# Patient Record
Sex: Female | Born: 1975
Health system: Southern US, Community
[De-identification: ages and names within clinical notes are randomized; demographics above are authoritative.]

## PROBLEM LIST (undated history)

## (undated) DIAGNOSIS — L709 Acne, unspecified: Secondary | ICD-10-CM

## (undated) DIAGNOSIS — E669 Obesity, unspecified: Secondary | ICD-10-CM

## (undated) HISTORY — DX: Obesity, unspecified: E66.9

## (undated) HISTORY — PX: OTHER SURGICAL HISTORY: SHX169

## (undated) HISTORY — DX: Acne, unspecified: L70.9

---

## 1998-02-21 ENCOUNTER — Emergency Department (HOSPITAL_COMMUNITY): Admission: EM | Admit: 1998-02-21 | Discharge: 1998-02-21 | Payer: Self-pay | Admitting: Emergency Medicine

## 1998-11-29 ENCOUNTER — Other Ambulatory Visit: Admission: RE | Admit: 1998-11-29 | Discharge: 1998-11-29 | Payer: Self-pay | Admitting: Obstetrics and Gynecology

## 1998-12-15 ENCOUNTER — Emergency Department (HOSPITAL_COMMUNITY): Admission: EM | Admit: 1998-12-15 | Discharge: 1998-12-15 | Payer: Self-pay | Admitting: Emergency Medicine

## 2000-02-14 ENCOUNTER — Other Ambulatory Visit: Admission: RE | Admit: 2000-02-14 | Discharge: 2000-02-14 | Payer: Self-pay | Admitting: *Deleted

## 2000-12-04 ENCOUNTER — Other Ambulatory Visit: Admission: RE | Admit: 2000-12-04 | Discharge: 2000-12-04 | Payer: Self-pay | Admitting: Obstetrics and Gynecology

## 2001-05-22 ENCOUNTER — Inpatient Hospital Stay (HOSPITAL_COMMUNITY): Admission: AD | Admit: 2001-05-22 | Discharge: 2001-05-22 | Payer: Self-pay | Admitting: Obstetrics & Gynecology

## 2001-06-03 ENCOUNTER — Inpatient Hospital Stay (HOSPITAL_COMMUNITY): Admission: AD | Admit: 2001-06-03 | Discharge: 2001-06-03 | Payer: Self-pay | Admitting: Obstetrics and Gynecology

## 2001-06-05 ENCOUNTER — Inpatient Hospital Stay (HOSPITAL_COMMUNITY): Admission: AD | Admit: 2001-06-05 | Discharge: 2001-06-07 | Payer: Self-pay | Admitting: Obstetrics & Gynecology

## 2001-06-08 ENCOUNTER — Encounter: Admission: RE | Admit: 2001-06-08 | Discharge: 2001-07-08 | Payer: Self-pay | Admitting: Obstetrics and Gynecology

## 2001-07-09 ENCOUNTER — Encounter: Admission: RE | Admit: 2001-07-09 | Discharge: 2001-08-08 | Payer: Self-pay | Admitting: Obstetrics and Gynecology

## 2001-07-11 ENCOUNTER — Other Ambulatory Visit: Admission: RE | Admit: 2001-07-11 | Discharge: 2001-07-11 | Payer: Self-pay | Admitting: Obstetrics and Gynecology

## 2001-09-08 ENCOUNTER — Encounter: Admission: RE | Admit: 2001-09-08 | Discharge: 2001-10-08 | Payer: Self-pay | Admitting: Obstetrics and Gynecology

## 2001-10-09 ENCOUNTER — Encounter: Admission: RE | Admit: 2001-10-09 | Discharge: 2001-11-08 | Payer: Self-pay | Admitting: Obstetrics and Gynecology

## 2001-12-09 ENCOUNTER — Encounter: Admission: RE | Admit: 2001-12-09 | Discharge: 2002-01-08 | Payer: Self-pay | Admitting: Obstetrics and Gynecology

## 2002-02-08 ENCOUNTER — Encounter: Admission: RE | Admit: 2002-02-08 | Discharge: 2002-03-10 | Payer: Self-pay | Admitting: Obstetrics and Gynecology

## 2003-02-11 ENCOUNTER — Other Ambulatory Visit: Admission: RE | Admit: 2003-02-11 | Discharge: 2003-02-11 | Payer: Self-pay | Admitting: Obstetrics and Gynecology

## 2003-03-02 ENCOUNTER — Encounter (INDEPENDENT_AMBULATORY_CARE_PROVIDER_SITE_OTHER): Payer: Self-pay | Admitting: Specialist

## 2003-03-02 ENCOUNTER — Ambulatory Visit (HOSPITAL_COMMUNITY): Admission: RE | Admit: 2003-03-02 | Discharge: 2003-03-02 | Payer: Self-pay | Admitting: Obstetrics and Gynecology

## 2003-03-12 ENCOUNTER — Emergency Department (HOSPITAL_COMMUNITY): Admission: AD | Admit: 2003-03-12 | Discharge: 2003-03-12 | Payer: Self-pay | Admitting: Family Medicine

## 2006-01-17 ENCOUNTER — Ambulatory Visit (HOSPITAL_COMMUNITY): Admission: RE | Admit: 2006-01-17 | Discharge: 2006-01-17 | Payer: Self-pay | Admitting: Obstetrics and Gynecology

## 2006-04-01 ENCOUNTER — Encounter (INDEPENDENT_AMBULATORY_CARE_PROVIDER_SITE_OTHER): Payer: Self-pay | Admitting: *Deleted

## 2006-04-01 ENCOUNTER — Ambulatory Visit (HOSPITAL_COMMUNITY): Admission: RE | Admit: 2006-04-01 | Discharge: 2006-04-01 | Payer: Self-pay | Admitting: Obstetrics and Gynecology

## 2007-06-26 ENCOUNTER — Inpatient Hospital Stay (HOSPITAL_COMMUNITY): Admission: RE | Admit: 2007-06-26 | Discharge: 2007-06-29 | Payer: Self-pay | Admitting: Obstetrics and Gynecology

## 2007-06-26 ENCOUNTER — Encounter (INDEPENDENT_AMBULATORY_CARE_PROVIDER_SITE_OTHER): Payer: Self-pay | Admitting: Obstetrics and Gynecology

## 2007-07-01 ENCOUNTER — Encounter: Admission: RE | Admit: 2007-07-01 | Discharge: 2007-07-30 | Payer: Self-pay | Admitting: Obstetrics and Gynecology

## 2007-07-31 ENCOUNTER — Encounter: Admission: RE | Admit: 2007-07-31 | Discharge: 2007-08-30 | Payer: Self-pay | Admitting: Obstetrics and Gynecology

## 2007-08-31 ENCOUNTER — Encounter: Admission: RE | Admit: 2007-08-31 | Discharge: 2007-09-30 | Payer: Self-pay | Admitting: Obstetrics and Gynecology

## 2007-10-01 ENCOUNTER — Encounter: Admission: RE | Admit: 2007-10-01 | Discharge: 2007-10-30 | Payer: Self-pay | Admitting: Obstetrics and Gynecology

## 2007-10-31 ENCOUNTER — Encounter: Admission: RE | Admit: 2007-10-31 | Discharge: 2007-11-30 | Payer: Self-pay | Admitting: Obstetrics and Gynecology

## 2007-12-01 ENCOUNTER — Encounter: Admission: RE | Admit: 2007-12-01 | Discharge: 2007-12-30 | Payer: Self-pay | Admitting: Obstetrics and Gynecology

## 2007-12-19 ENCOUNTER — Emergency Department (HOSPITAL_COMMUNITY): Admission: EM | Admit: 2007-12-19 | Discharge: 2007-12-19 | Payer: Self-pay | Admitting: Family Medicine

## 2007-12-31 ENCOUNTER — Encounter: Admission: RE | Admit: 2007-12-31 | Discharge: 2008-01-30 | Payer: Self-pay | Admitting: Obstetrics and Gynecology

## 2008-01-31 ENCOUNTER — Encounter: Admission: RE | Admit: 2008-01-31 | Discharge: 2008-03-01 | Payer: Self-pay | Admitting: Obstetrics and Gynecology

## 2008-03-02 ENCOUNTER — Encounter: Admission: RE | Admit: 2008-03-02 | Discharge: 2008-03-29 | Payer: Self-pay | Admitting: Obstetrics and Gynecology

## 2008-03-30 ENCOUNTER — Encounter: Admission: RE | Admit: 2008-03-30 | Discharge: 2008-04-29 | Payer: Self-pay | Admitting: Obstetrics and Gynecology

## 2008-08-27 IMAGING — RF DG HYSTEROGRAM
7 series · 7 of 7 positions shown · IV contrast (omnipaque)
Comparison: none

CLINICAL DATA: Secondary infertility.  Assess tubal patency.  
HYSTEROSALPINGOGRAM:
Following sterile cleansing of the external cervix os with Betadine, hysterosalpingogram was performed via injection of approximately 15 cc of Omnipaque 300 into the endometrial canal without difficulty.
The endometrial canal is notable for the present of a focal filling defect emanating from the right cornual region to the right lower uterine segment portion of the myometrium.  This may represent stigmata from a submucosal component of a right-sided fibroid or may represent a polyp.  Correlation with pelvic ultrasound would be recommended for complete assessment.  The remainder of the endometrial canal is unremarkable.  The left fallopian tube has a normal morphology and free intraperitoneal spill is documented from this side.  The right fallopian tube has a normal morphology to the cornual and isthmic regions.  There is some free spill from the right side of the tube but there is also loculation of contrast in the region of the ampullary portion of the tube which is obscured as a result and the finding persists on post drainage film suggesting the possibility of peritubal adhesions in this location.  No evidence for loculation of contrast is seen on the left to suggest peritubal or peri-ovarian adhesions on this side.

[Series 1: run · 1 of 1 slices shown (1 of 7)]
[im 1/1]
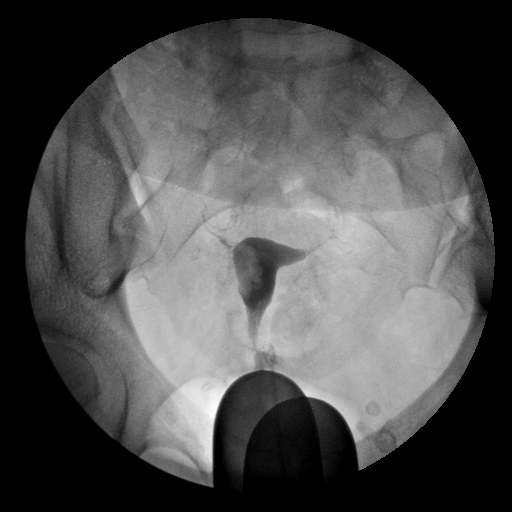

[Series 2: run · 1 of 1 slices shown (2 of 7)]
[im 1/1]
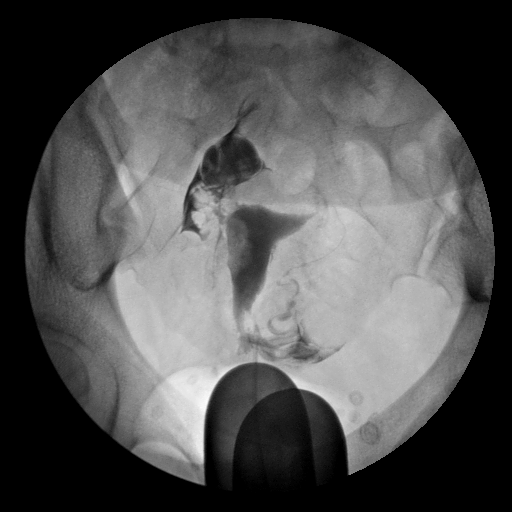

[Series 3: run · 1 of 1 slices shown (3 of 7)]
[im 1/1]
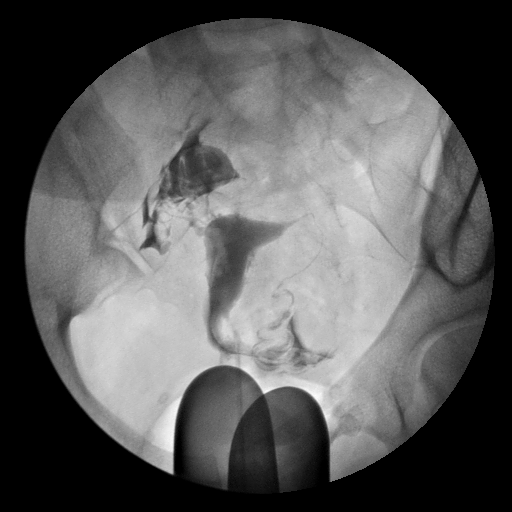

[Series 4: run · 1 of 1 slices shown (4 of 7)]
[im 1/1]
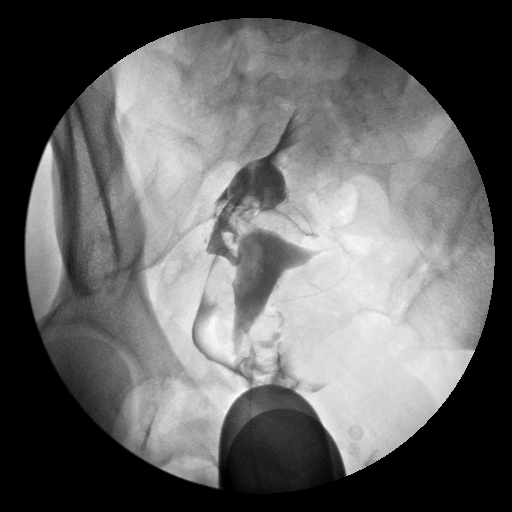

[Series 5: run · 1 of 1 slices shown (5 of 7)]
[im 1/1]
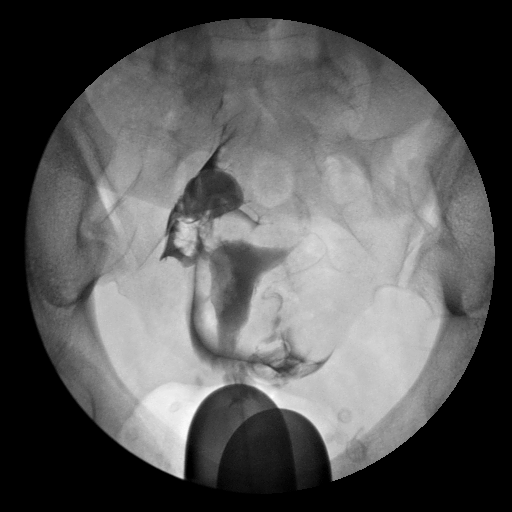

[Series 6: run · 1 of 1 slices shown (6 of 7)]
[im 1/1]
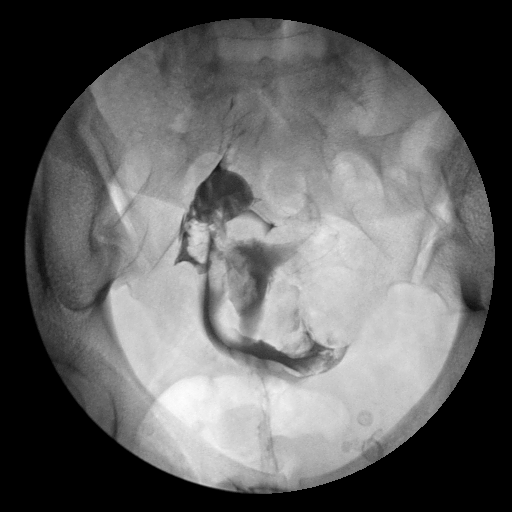

[Series 7: run · 1 of 1 slices shown (7 of 7)]
[im 1/1]
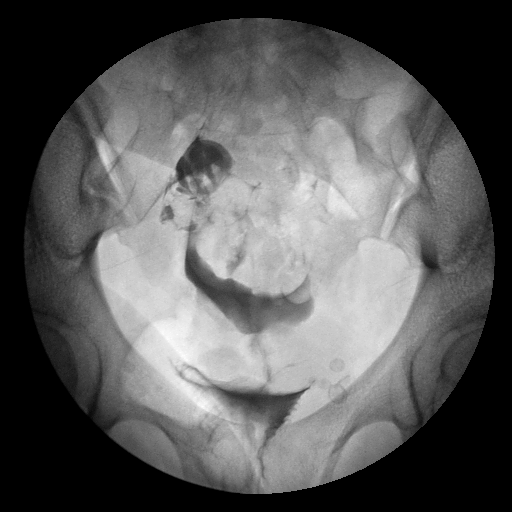

[7 of 7 positions shown; findings below may reference images not displayed]

IMPRESSION: 1.  Filling defect in the right lateral aspect of the endometrial canal worrisome for the presence of either a broad based polyp or submucosal component to a fibroid.  Correlation with pelvic ultrasound is recommended for complete assessment.  
2.  Left tubal patency confirmed. 
3.  Some free spill is noted from the right but the appearance with loculation of contrast in distal peritubal region suggest the possibility of peritubal adhesions around the ampullary portion of the tube.  
Today?s scan findings were reviewed and discussed with the patient.

## 2009-04-03 ENCOUNTER — Emergency Department (HOSPITAL_COMMUNITY): Admission: EM | Admit: 2009-04-03 | Discharge: 2009-04-03 | Payer: Self-pay | Admitting: Emergency Medicine

## 2010-04-15 ENCOUNTER — Inpatient Hospital Stay (INDEPENDENT_AMBULATORY_CARE_PROVIDER_SITE_OTHER)
Admission: RE | Admit: 2010-04-15 | Discharge: 2010-04-15 | Disposition: A | Discharge status: Home / Self Care | Payer: 59 | Source: Ambulatory Visit | Attending: Family Medicine | Admitting: Family Medicine

## 2010-04-15 DIAGNOSIS — B354 Tinea corporis: Secondary | ICD-10-CM

## 2010-04-15 DIAGNOSIS — L989 Disorder of the skin and subcutaneous tissue, unspecified: Secondary | ICD-10-CM

## 2010-04-15 LAB — GLUCOSE, CAPILLARY: Glucose-Capillary: 86 mg/dL (ref 70–99)

## 2010-05-30 NOTE — Op Note (Signed)
NAME:  Patricia Mayo, Patricia Mayo NO.:  [id number]   MEDICAL RECORD NO.:  [id number]          PATIENT TYPE:  INP   LOCATION:  9106                          FACILITY:  [facility]   PHYSICIAN:  Maxie Better, M.D.DATE OF BIRTH:  06-Jul-1975   DATE OF PROCEDURE:  06/26/2007  DATE OF DISCHARGE:                               OPERATIVE REPORT   PREOPERATIVE DIAGNOSES:  1. Previous  Fundal defect.  2. Term gestation.   POSTOPERATIVE DIAGNOSES:  1. Previous Fundal defect.  2. Term gestation.  3. Tubal adhesions.   PROCEDURE:  1. Primary cesarean section,  Kerr hysterotomy.  2. Right salpingectomy.   ANESTHESIA:  Spinal.   SURGEON:  Maxie Better, MD   ASSISTANT:  None.   PROCEDURE:  Under adequate spinal anesthesia, the patient was placed in  a supine position with the left lateral tilt.  She was sterilely prepped  and draped in usual fashion.  Indwelling Foley catheter was sterilely  placed.  Marcaine 0.25% was injected along the planned Pfannenstiel skin  incision site.  A Pfannenstiel skin incision was then made and carried  down to the rectus fascia.  The rectus fascia was opened transversely.  The rectus fascia was then bluntly and sharply dissected off the rectus  muscle in superior and inferior fashion.  The rectus muscle was split in  midline.  The parietal peritoneum was opened sharply and extended.  Large vessels were noted in the lower uterine segment.  The  vesicouterine peritoneum was opened transversely.  The uterus which was  very dextrorotated was centralized.  The bladder was then bluntly  dissected off the lower uterine segment.  A curvilinear low-transverse  uterine incision was then made and with blunt separation, was opened.  Artificial rupture of membranes and clear fluid noted.  Subsequent  delivery of a live female was accomplished.  Cord around the neck x1 was  reduced.  Baby was bulb suctioned on the abdomen.  Cord was clamped and  cut.  The  baby was transferred to the awaiting pediatrician who assigned  Apgars of 9 at 9 and 1 at 5 minutes.  The placenta was spontaneous and  intact, not sent to pathology.  The uterine cavity was cleaned of  debris.  The uterus was exteriorized to inspect the fundal defect that  had been previously documented on laparoscopy.  It was cleared, still  evident, but with a superficial membrane overlying.  The uterine  incision had no extension.  There was a bleeding on the right vessel  from those previous large vessels that were noted.  Individual figure-of-  eight sutures were then placed for hemostasis.  The uterine incision was  then closed in 2 layers, the first layer with 0 Monocryl in running  locked stitch and the second layer was imbricated using 0 Monocryl  suture.  Small bleeders inferiorly was cauterized.  Additional  hemostasis was then accomplished with figure-of-eight sutures in the  midline with good hemostasis noted.  Attention was then turned back to  the adnexa.  Left tube and ovaries were normal.  Right ovary was normal  and was noted that there was almost no presence of the fallopian tube on  the right except the fallopian tube was adherent to the fundus of the  uterus all the way across with the fimbriated ends not really  identifiable.  Using cautery, a careful dissection was then performed to  take the fallopian tube and re-establish its normal anatomy; however,  after that was performed, it was noted that there was no fimbriated end  noted.  There was no ability to imbricate the fallopian tube which  appeared to be destroyed at that point.  A decision was then made to  remove that tube to decrease any risk of ectopic pregnancy.  One clamp  was performed and the tube was removed.  The base was tied with a 2-0  Vicryl suture.  Small bleeding in the fundal aspect was hemostasized  with 3-0 Vicryl suture.  The uterus was then returned back to the  abdomen after copious  irrigation was performed.  The uterine incision  was inspected and small bleeders again cauterized with good hemostasis  noted.  The parietal peritoneum was closed with 2-0 Vicryl.  The rectus  fascia was closed with 0 Vicryl x2.  Bleeders on the surface of the  rectus fascia was then cauterized and suprapubic space was also  cauterized.  The subcutaneous area was approximated with interrupted 2-0  plain sutures.  Skin approximated using Ethicon staples.  Specimen was  right fallopian tube sent to pathology, placenta not sent.  Estimated  blood loss was 800 mL.  Intraoperative fluid was 3800 mL.  Urine output  600 mL, clear yellow urine.  Sponge and instrument counts x2 was  correct.  Weight of the baby was 7 pounds 12 ounces.  Complication was  none.  The patient tolerated the procedure well and was transferred to  recovery in stable condition.      Maxie Better, M.D.  Electronically Signed     [place]/MEDQ  D:  06/26/2007  T:  06/27/2007  Job:  [id number]

## 2010-05-30 NOTE — Discharge Summary (Signed)
NAME:  Patricia Mayo, Patricia Mayo NO.:  [id number]   MEDICAL RECORD NO.:  [id number]          PATIENT TYPE:  INP   LOCATION:  9106                          [facility]ILITY[facility]  [facility]   PHYSICIAN:  Maxie Better, M.D.DATE OF BIRTH:  1975/09/09   DATE OF ADMISSION:  06/26/2007  DATE OF DISCHARGE:  06/29/2007                               DISCHARGE SUMMARY   ADMISSION DIAGNOSES:  1. Term gestation.  2. Fundal uterine perforation defect.   DISCHARGE DIAGNOSES:  1. Term gestation, delivered.  2. Fundal perforation defect.  3. Right tubal disease.   PROCEDURES:  1. Primary cesarean section.  2. Right salpingectomy.   HISTORY OF PRESENT ILLNESS:  A 35 year old, gravida 4, para 1-0-2-1  female at term with a fundal perforation defect, which resulted in need  for primary cesarean section.   HOSPITAL COURSE:  The patient was admitted to [facility].  She  underwent a primary cesarean section and lyse.  The patient delivered a  live female, 7 pounds 12 ounces, and Apgars of 9 and 9.  The left tube  and ovaries were noted to be normal.  The right tube, however, was  surrounded with adhesions adherent to the right fundal aspect.  Please  see the dictated note for the operative surgery.  The patient had an  uncomplicated postoperative course.  The CBC on postop day #1 showed a  hemoglobin 9.8, hematocrit 27.9, platelet count of 118,000, and white  count of 4.9.  She had a repeat platelet count on June 28, 2007, that  was 151,000.  The pathology of the right fallopian tube showed benign  fallopian tube with mild chronic inflammation, hemorrhage, adhesion and  serosal adhesion.  By postop day #3, the patient was doing well.  She  had actually had a bowel movement.  Incision had no evidence of any  infection.  She was deemed well for discharge.   DISPOSITION:  Home.   CONDITION:  Stable.   DISCHARGE MEDICATIONS:  1. Percocet 1-2 tablets every 6 hours p.r.n. pain.  2. Niferex 1  p.o. daily.  3. Prenatal vitamins 1 p.o. daily.  4. Motrin 600 mg every 8 hours p.r.n. pain.   FOLLOWUP APPOINTMENTS:  At [facility] in 6 weeks.      Maxie Better, M.D.  Electronically Signed     [place]/MEDQ  D:  08/07/2007  T:  08/07/2007  Job:  [id number]

## 2010-06-02 NOTE — Op Note (Signed)
NAME:  Patricia Mayo, BIGLER NO.:  [id number]   MEDICAL RECORD NO.:  [id number]                   PATIENT TYPE:  AMB   LOCATION:  SDC                                  [facility]ILITY:  [facility]   PHYSICIAN:  Maxie Better, M.D.            DATE OF BIRTH:  1975-04-20   DATE OF PROCEDURE:  03/02/2003  DATE OF DISCHARGE:                                 OPERATIVE REPORT   PREOPERATIVE DIAGNOSES:  Elective termination intrauterine gestation at 8  plus weeks.   POSTOPERATIVE DIAGNOSES:  Elective termination intrauterine gestation at 8  plus weeks.   PROCEDURE:  Suction, dilation and evacuation.   ANESTHESIA:  MAC paracervical block.   SURGEON:  Maxie Better, M.D.   INDICATIONS FOR PROCEDURE:  A 35 year old, gravida 3, para 1-0-1-1 female at  eight plus weeks gestation by ultrasound conceived on birth control pills  who desires termination of pregnancy.  Her blood type is noted to be O  positive.  The risks and benefits of the procedure have been explained to  the patient, consent was signed, the patient was transferred to the  operating room.   DESCRIPTION OF PROCEDURE:  Under adequate monitored anesthesia, the patient  was placed in the dorsal lithotomy position, she was sterilely prepped and  draped in the usual fashion, the bladder was catheterized for a moderate  amount of urine. Examination under anesthesia revealed an anteverted eight  week size uterus without any adnexal mass.  A bivalve speculum was placed in  the vagina, 20 mL of 1% Nesacaine was injected paracervically at 3 and 9  o'clock. The cervix was noted to be parous. The anterior lip of the cervix  was grasped with a single tooth tenaculum, the cervix was then serially  dilated up to a #29 Pratt dilator. A #8 mm curved suction cannula was  introduced into the uterine cavity without incident. A moderate amount of  products of conception was obtained. The cavity was then curetted,  resuctioned  until all tissue was then felt to have been removed at which  time all instrument were then removed from the vagina.  The specimen was  labeled as products of conception and sent to pathology. Estimated blood  loss was about 50 mL.  Complication was none.  The patient tolerated the  procedure well and was transferred to the recovery room in stable condition.                                               Maxie Better, M.D.    [place]/MEDQ  D:  03/02/2003  T:  03/02/2003  Job:  [id number]

## 2010-06-02 NOTE — H&P (Signed)
[facility]  Patient:    Patricia Mayo, Patricia Mayo Visit Number: [id number] MRN: [id number]          Service Type: OBS Location: [facility] Attending Physician:  Genia Del Dictated by:   Genia Del, M.D. Admit Date:  06/05/2001                           History and Physical  DATE OF BIRTH:  12/01/1975.  BRIEF HISTORY The patient is a 35 year old G2, P0, A1, expected date of delivery by ultrasound is June 22, 2001, at 37 weeks and 4 days gestation.  REASON FOR ADMISSION:  Spontaneous rupture of membranes, clear fluid around 6:30 a.m. on Jun 05, 2001.  HISTORY OF PRESENT ILLNESS:  The patient felt abundant clear fluid leak this morning around 6:30.  It continued to drip.  The patient felt very mild irregular uterine contractions.  No vaginal bleeding.  Good fetal movements. No PIH symptoms.  She was advised to present immediately to maternity admission for evaluation.  PAST MEDICAL HISTORY:  Negative.  PAST SURGICAL HISTORY:  Negative.  PAST OBSTETRICAL HISTORY:  Positive for a therapeutic abortion at about 8 weeks in 1997, no complication.  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  FAMILY HISTORY:  Noncontributory.  SOCIAL HISTORY:  The patient is married and a nonsmoker.  HISTORY OF PRESENT PREGNANCY:  First trimester was normal.  Labs showed a hemoglobin of 12.8, platelets 235, blood type Rh 0+. Rh antibodies negative. Sickle cell trait negative.  Thalassemia negative.  RPR nonreactive.  HBsAg negative.  HIV nonreactive.  Rubella immune.  Pap smear within normal limits. Gonorrhea and Chlamydia negative.  At 11 plus weeks she had an ultrasound and dating was changed by ultrasound for an expected date of delivery June 22, 2001.  In the second trimester the triple test was within normal limits at 16 weeks. An ultrasound reviewing all anatomy was within  normal limits.  Cervix 2.7 cm. No dilatation.  Placenta was anterior normal.  At follow up vaginal examination cervix was stable 2.5 to 3.0 cm long and firm and closed.  In the third trimester a 1 hour glucose tolerance test was normal at 119.  The patient developed mild cholestasis of pregnancy at around 34 plus weeks.  She had an ultrasound done at 35 weeks showing an amniotic fluid at 31st percentile growth at 35 percentile.  On the same visit at 35 plus weeks her blood pressures were borderline at 146/80, 140/82.  She had PIH labs that were all within normal limits.  That was done again at 36 plus weeks and the [facility] labs were again within normal limits.  NNST was done which was not reactive, biophysical profile was reassuring.  Group B streptococcus was positive at 35+ weeks.  REVIEW OF SYSTEMS:  Constitutional:  Negative.  HEENT:  Negative. Respiratory:  Negative.  Cardiovascular:  Negative.  Urologic/GI:  Negative. Neurologic/endocrinologic/dermatologic:  Negative.  PHYSICAL EXAMINATION:  GENERAL:  No apparent distress.  VITAL SIGNS:  Blood pressure 133/76, pulse at 102, respiratory rate 20, temperature 98.0.  LUNGS:  Clear bilaterally.  HEART:  Regular cardiac rhythm, no murmur.  ABDOMEN: Gravid uterus.  Uterine height corresponds well to 37 weeks. Cephalic presentation.  VAGINAL:  Examination by nurse, abundant clear amniotic fluid. Vaginal examination:  Cervix is fingertip.  Cephalic presentation.  Lower limbs normal.  FETAL  HEART RATE:  Monitoring of fetal heart rate 140, with variability present. Not reactive yet.  No decelerations.  Uterine contractions irregular and mild.  IMPRESSION:  G2, P0, A1, 37 plus weeks gestation with spontaneous rupture of membranes, not currently in labor.  Group B streptococcus positive.  Fetal well-being reassuring.  PLAN:  Admit to labor and delivery.  Start on penicillin G protocol and Pitocin low dose protocol.  Monitoring and  expectant management for a probable vaginal delivery.  IMPRESSION:  G2, P0, A1, 37 plus weeks gestation with spontaneous rupture of membranes, not currently in labor. Group B streptococcus positive. Fetal well-being reassuring. Dictated by:   Genia Del, M.D. Attending Physician:  Genia Del DD:  06/05/01 TD:  06/05/01 Job: [id number]

## 2010-06-02 NOTE — Op Note (Signed)
[facility]  Patient:    Patricia Mayo, Patricia Mayo Visit Number: [id number] MRN: [id number]          Service Type: OBS Location: 910A 9104 01 Attending Physician:  Genia Del Dictated by:   Marina Gravel, M.D. Proc. Date: 06/05/01 Admit Date:  06/05/2001 Discharge Date: 06/07/2001                             Operative Report  PREOPERATIVE DIAGNOSES:       1. Term intrauterine pregnancy.                               2. Severe repetitive variables.                               3. Occipitoposterior position.  POSTOPERATIVE DIAGNOSES:      1. Term intrauterine pregnancy.                               2. Severe repetitive variables.                               3. Occipitoposterior position.  OPERATION:                    Vaccum-assisted vaginal delivery from                               complete-complete posterior direct OP position.  SURGEON:                      Marina Gravel, M.D.  INDICATIONS:                  The patient was pushing and noted to have repetitive severe variables to the 70s.  Also found to be direct OP position. Given these findings, I recommend we proceed with a vacuum-assisted vaginal delivery to expedite the delivery of the fetus.  DESCRIPTION OF PROCEDURE:     After adequate epidural anesthesia was confirmed, a Foley catheter had already drained the bladder, and this was removed.  The patient was examined under anesthesia with the above findings noted.  The Mityvac was placed on the fetal vertex just anterior to the posterior fontanelle in the midline.  With the following contraction, the Mityvac was pumped up to the high green zone, and with contraction, three tractions made per contraction.  Adequate descent with each contraction.  It took a total of four contractions to deliver the fetus.  One pop-off encountered.  The vacuum was released between contractions and a second degree episiotomy was made to facilitate  the delivery.  Subsequently, a viable female infant, Apgars of 8 and 10 was delivered.  The NICU team was present.  No apparent injuries to the fetus, mild caput noted.  The placenta was expelled spontaneously.  The second degree episiotomy was closed with a running suture of 2-0 and 3-0 chromics.  Estimated blood loss was 500 cc.  There were no complications. Dictated by:   Marina Gravel, M.D. Attending Physician:  Genia Del DD:  06/05/01 TD:  06/09/01 Job: [id number]

## 2010-06-02 NOTE — Op Note (Signed)
NAME:  ALEXSUS, PAPADOPOULOS NO.:  [id number]   MEDICAL RECORD NO.:  [id number]          PATIENT TYPE:  AMB   LOCATION:  SDC                           [facility]ILITY[facility]  [facility]   PHYSICIAN:  Maxie Better, M.D.DATE OF BIRTH:  1975/06/18   DATE OF PROCEDURE:  04/01/2006  DATE OF DISCHARGE:                               OPERATIVE REPORT   PREOPERATIVE DIAGNOSIS:  Endometrial thickening, infertility.   PROCEDURE:  diagnostic hysteroscopy, dilation and curettage,  hysteroscopic resection of endometrial mass, diagnostic laparoscopy,  chromopertubation, lysis of adhesions.   POSTOPERATIVE DIAGNOSIS:  Endometrial thickening, infertility.   ANESTHESIA:  General, paracervical block.   SURGEON:  Maxie Better, M.D.   ASSISTANT:  Dr. Richardean Sale for the laparoscopy   PROCEDURE:  Under adequate general anesthesia the patient is placed in  the dorsal lithotomy position.  She was sterilely prepped and draped in  usual fashion.  An indwelling Foley catheter was sterilely placed.  Examination under anesthesia revealed an anteverted uterus.  No adnexal  small masses could be appreciated.  A bivalve speculum was placed in the  vagina.  10 mL of 1% Nesacaine was injected paracervically at 3 and 9  o'clock.  The anterior lip of the cervix grasped with a single-tooth  tenaculum.  The cervix was then serially dilated up number 25 Pratt  dilator.  A diagnostic small hysteroscope was introduced into the  uterine cavity without incident.  Inspection revealed no masses in the  endocervical canal. The left tubal ostia could be seen well.  The right  tubal ostia could not be seen due to a fungating mass arising off the  lateral wall of the uterus on the right. The hysteroscope was removed.  The cervix was further dilated to #29 [facility] dilator.  A resectoscope was  introduced into the uterine cavity.  The fungating mass was resected off  the right lateral wall. After the resection of the  mass the right tubal  ostia could then be seen. The resectoscope was removed.  The cavity was  then gently curetted. The bivalve speculum was removed.  The Acorn  cannula was introduced into the cervical os and attached to the  tenaculum for manipulation of the uterus maintaining sterile technique.  Attention was then turned to the abdomen.  Quarter percent Marcaine was  injected infraumbilically and small vertical infraumbilical incision was  then made.  Veress needle was introduced, tested with normal saline and  carbon dioxide was insufflated.  Opening pressure of 7 was noted. 2.5  liters of CO2 was insufflated. The Veress needle was then removed.  A  disposable 10 mm trocar was introduced in the abdomen without incident.  A lighted video laparoscope was introduced through that port and the  pelvis inspected.  There was fluid presumably secondary to the  hysteroscope just being performed and noted in the pelvis. A small  incision was then placed suprapubically and 5 mm port was placed under  direct visualization. Using the probe the pelvis was inspected.  The  upper abdomen was also inspected and normal liver edge was noted.  No  adhesions noted.  The appendix appeared to be elongated, had a small  adhesion proximally, otherwise was normal and appeared to be possibly  retrocecal. The left tube and ovary was noted to be normal and the fluid  was aspirated out of the posterior cul-de-sac. No evidence of  endometriosis was noted. The right ovary was normal.  The right tube at  its midportion was appeared to be adherent to the fundal posterior  aspect of the uterus and appeared slightly dilated. The distal end did  not appear to be clubbed. On close inspection it appears that the uterus  has a perforation with the tube sucked into that perforation. Decision  was then made to place a third site in the right lower quadrant. A small  incision was then made and a 5-mm port was placed and  atraumatic grasper  used to grasp the right fallopian tube which was folded on itself and  the sharp dissection was then performed and the portion of tube removed  out of the small uterine defect. At that point, decision was then made  to perform a chromopertubation to further assess the status of that  right tube and IV antibiotic was started. Dilute methylene blue was  injected through the acorn port and with the injection fluid transmitted  across the perforation. At that point the procedure was terminated. The  abdomen was irrigated and suctioned debris.  The fallopian tube was  inspected.  No bleeders were noted.  The area overlying the lower  uterine defect was cauterized for hemostasis and procedure was felt to  be complete. At that point the lower ports removed under direct  visualization.  The abdomen was deflated.  The infraumbilical port was  then removed. The lower incision was closed with a 4-0 Vicryl  subcuticular stitch.  The infraumbilical port site was closed with 0  Vicryl figure-of-eight suture for the fascia and the skin approximated 4-  0 Vicryl suture.  The instruments in the vagina were removed.  No trauma  or laceration of the cervix was noted.  Specimen was the resected  endometrial mass and endometrial curettings sent to pathology.  Estimated blood loss was minimal.  Complication was none. The patient  tolerated the procedure well, was transferred to recovery in stable  condition.      Maxie Better, M.D.  Electronically Signed     [place]/MEDQ  D:  04/01/2006  T:  04/02/2006  Job:  [id number]

## 2010-06-02 NOTE — H&P (Signed)
NAME:  Patricia Mayo, BURGGRAF NO.:  [id number]   MEDICAL RECORD NO.:  [id number]                   PATIENT TYPE:  AMB   LOCATION:  SDC                                  FACILITY:  [facility]   PHYSICIAN:  Maxie Better, M.D.            DATE OF BIRTH:  1975/02/28   DATE OF ADMISSION:  DATE OF DISCHARGE:                                HISTORY & PHYSICAL   CHIEF COMPLAINT:  Desires termination of pregnancy.   HISTORY OF PRESENT ILLNESS:  The patient is a  35 year old gravida 3, para  1, 0-1-1 married black female, 8 plus weeks gestation by ultrasound,  conceived on birth control pills who is now admitted for suction dilation  and evacuation secondary to desired termination of pregnancy.   ALLERGIES:  No known drug allergies.   MEDICATIONS:  None.   MEDICAL HISTORY:  Negative.   PAST SURGICAL HISTORY:  Dilatation and evacuation in 1997.   OBSTETRIC HISTORY:  Therapeutic abortion in 1997. Vacuum assisted vaginal  delivery in 2003.   FAMILY HISTORY:  Noncontributory.   SOCIAL HISTORY:  Married, nonsmoker. In nursing tech program in school. One  child.   REVIEW OF SYSTEMS:  Negative.   PHYSICAL EXAMINATION:  GENERAL:  Well developed, well nourished black female  in no acute distress.  VITAL SIGNS:  Blood pressure 110/72, temperature 98.3, weight 166 pounds.  HEENT:  Sclerae anicteric. Pink conjunctivae. Oropharynx negative.  HEART:  Regular rate and rhythm without murmur.  LUNGS:  Clear to auscultation.  BREASTS:  Soft, nontender, no palpable mass.  BACK:  No CVA tenderness.  ABDOMEN:  Soft, nontender.  NODES:  No palpable supraclavicular or axillary nodes.  EXTREMITIES:  No edema. No calf tenderness.  PELVIC:  Vulva showed no lesions. Vagina no discharge. Cervix was parous.  Uterus was anterior, 8 week size. Adnexa nontender, no palpable mass.  RECTAL:  Examination deferred.   IMPRESSION:  Desired termination of pregnancy, intrauterine gestation at  8  plus weeks.   PLAN:  Suction dilation evacuation. The risks and benefits of the procedure  have been explained to the patient including  but not limited to infection,  bleeding, retained products of conception and its management, injury to  surrounding  organ structures such as the bladder, bowel or ureter, internal  scar tissue that may affect fertility in the future, bleeding which may  require blood transfusion. Uterine perforation and its management  was  reviewed. The patient's blood type is O positive. Routine admission labs  have been ordered.                                               Maxie Better, M.D.    [place]/MEDQ  D:  03/01/2003  T:  03/01/2003  Job:  [id number]

## 2010-08-28 ENCOUNTER — Other Ambulatory Visit: Payer: Self-pay | Admitting: Internal Medicine

## 2010-08-28 DIAGNOSIS — B359 Dermatophytosis, unspecified: Secondary | ICD-10-CM

## 2010-09-15 ENCOUNTER — Other Ambulatory Visit: Payer: 59

## 2010-09-15 ENCOUNTER — Ambulatory Visit
Admission: RE | Admit: 2010-09-15 | Discharge: 2010-09-15 | Disposition: A | Discharge status: Home / Self Care | Payer: 59 | Source: Ambulatory Visit | Attending: Internal Medicine | Admitting: Internal Medicine

## 2010-09-15 DIAGNOSIS — B359 Dermatophytosis, unspecified: Secondary | ICD-10-CM

## 2010-10-12 LAB — COMPREHENSIVE METABOLIC PANEL
ALT: 18
AST: 35
Albumin: 2.4 — ABNORMAL LOW
Alkaline Phosphatase: 111
CO2: 26
Calcium: 9.1
Creatinine, Ser: 0.86
GFR calc non Af Amer: >60
Glucose, Bld: 99
Sodium: 140
Total Bilirubin: 0.5
Total Protein: 5.4 — ABNORMAL LOW

## 2010-10-12 LAB — CBC
HCT: 36.2
Hemoglobin: 10.5 — ABNORMAL LOW
Hemoglobin: 10.6 — ABNORMAL LOW
MCHC: 35.1
MCV: 91.8
MCV: 92.8
MCV: 92.9
Platelets: 151
Platelets: 160
RBC: 3.01 — ABNORMAL LOW
RDW: 14.1
RDW: 14.4
WBC: 4.9
WBC: 5.8

## 2010-10-12 LAB — RPR: RPR Ser Ql: NONREACTIVE

## 2010-10-12 LAB — URIC ACID: Uric Acid, Serum: 7

## 2010-10-20 LAB — CULTURE, ROUTINE-ABSCESS: Culture: NO GROWTH

## 2010-12-24 ENCOUNTER — Emergency Department (HOSPITAL_COMMUNITY)
Admission: EM | Admit: 2010-12-24 | Discharge: 2010-12-24 | Disposition: A | Discharge status: Home / Self Care | Payer: 59 | Source: Home / Self Care | Attending: Emergency Medicine | Admitting: Emergency Medicine

## 2010-12-24 DIAGNOSIS — R05 Cough: Secondary | ICD-10-CM

## 2010-12-24 DIAGNOSIS — R51 Headache: Secondary | ICD-10-CM

## 2010-12-24 MED ORDER — ACETAMINOPHEN-CODEINE #3 300-30 MG PO TABS
1.0000 | ORAL_TABLET | Freq: Four times a day (QID) | ORAL | Status: AC | PRN
Start: 2010-12-24 — End: 2011-01-03

## 2010-12-24 NOTE — ED Notes (Signed)
C/o sinus pressure/facial pain, neck pain, sore throat, congestion, denies n/v/d or fever.

## 2010-12-24 NOTE — ED Provider Notes (Addendum)
History     CSN: [id number] Arrival date & time: 12/24/2010  6:15 PM   First MD Initiated Contact with Patient 12/24/10 1635      Chief Complaint  Patient presents with  . Sinusitis    sinus pressure, congestion, neck pain, sore throat    (Consider location/radiation/quality/duration/timing/severity/associated sxs/prior treatment) HPI Comments: sinus pressure "my face hurts" also my throat hurts feel body aches and feel very tired, some chills I think but no fevers so far or cough"  Patient is a 35 y.o. female presenting with sinusitis. The history is provided by the patient.  Sinusitis  This is a new problem. The current episode started 12 to 24 hours ago. The problem has been gradually worsening. There has been no fever. The pain is moderate. Associated symptoms include chills, sinus pressure and sore throat. Pertinent negatives include no congestion, no ear pain, no hoarse voice, no swollen glands, no cough and no shortness of breath. She has tried nothing for the symptoms. The treatment provided no relief.    History reviewed. No pertinent past medical history.  Past Surgical History  Procedure Date  . Cesarean section     History reviewed. No pertinent family history.  History  Substance Use Topics  . Smoking status: Not on file  . Smokeless tobacco: Not on file  . Alcohol Use:     OB History    Grav Para Term Preterm Abortions TAB SAB Ect Mult Living                  Review of Systems  Constitutional: Positive for chills.  HENT: Positive for sore throat and sinus pressure. Negative for ear pain, congestion and hoarse voice.   Respiratory: Negative for cough and shortness of breath.     Allergies  Review of patient's allergies indicates no known allergies.  Home Medications   Current Outpatient Rx  Name Route Sig Dispense Refill  . NYQUIL PO Oral Take by mouth.      . ACETAMINOPHEN-CODEINE #3 300-30 MG PO TABS Oral Take 1-2 tablets by mouth every 6 (six)  hours as needed for pain. 15 tablet 0    BP 127/83  Pulse 90  Temp(Src) 98.8 F (37.1 C) (Oral)  Resp 18  SpO2 100%  LMP 11/12/2010  Physical Exam  ED Course  Procedures (including critical care time)  Labs Reviewed - No data to display No results found.   1. Headache   2. Cough       MDM  Normal exam symptomatic less < 24 hrs        Jimmie Molly, MD 12/24/10 7829  Jimmie Molly, MD 12/24/10 2050

## 2011-09-07 ENCOUNTER — Telehealth: Payer: Self-pay | Admitting: Oncology

## 2011-09-07 NOTE — Telephone Encounter (Signed)
S/W pt re NP appt 9/9 @ 3 W/ Dr. Gaylyn Rong  Referring Dr. Gaylyn Rong Dx- Leukopenia NP packet mailed out.

## 2011-09-07 NOTE — Telephone Encounter (Signed)
C/D on 8/23 NP appt 9/9.

## 2011-09-21 ENCOUNTER — Encounter: Payer: Self-pay | Admitting: Oncology

## 2011-09-21 DIAGNOSIS — D709 Neutropenia, unspecified: Secondary | ICD-10-CM | POA: Insufficient documentation

## 2011-09-21 DIAGNOSIS — D72819 Decreased white blood cell count, unspecified: Secondary | ICD-10-CM | POA: Insufficient documentation

## 2011-09-24 ENCOUNTER — Ambulatory Visit: Payer: 59

## 2011-09-24 ENCOUNTER — Other Ambulatory Visit (HOSPITAL_BASED_OUTPATIENT_CLINIC_OR_DEPARTMENT_OTHER): Payer: 59 | Admitting: Lab

## 2011-09-24 ENCOUNTER — Ambulatory Visit (HOSPITAL_BASED_OUTPATIENT_CLINIC_OR_DEPARTMENT_OTHER): Payer: 59 | Admitting: Oncology

## 2011-09-24 ENCOUNTER — Encounter: Payer: Self-pay | Admitting: Oncology

## 2011-09-24 ENCOUNTER — Telehealth: Payer: Self-pay | Admitting: Oncology

## 2011-09-24 VITALS — BP 122/75 | HR 90 | Temp 98.6°F | Resp 20 | Ht 64.0 in | Wt 168.3 lb

## 2011-09-24 DIAGNOSIS — D72819 Decreased white blood cell count, unspecified: Secondary | ICD-10-CM

## 2011-09-24 DIAGNOSIS — D709 Neutropenia, unspecified: Secondary | ICD-10-CM

## 2011-09-24 DIAGNOSIS — R5383 Other fatigue: Secondary | ICD-10-CM

## 2011-09-24 LAB — MORPHOLOGY: PLT EST: ADEQUATE

## 2011-09-24 LAB — COMPREHENSIVE METABOLIC PANEL (CC13)
AST: 14 U/L (ref 5–34)
Alkaline Phosphatase: 43 U/L (ref 40–150)
CO2: 24 mEq/L (ref 22–29)
Chloride: 106 mEq/L (ref 98–107)
Creatinine: 0.8 mg/dL (ref 0.6–1.1)
Sodium: 138 mEq/L (ref 136–145)
Total Bilirubin: 0.2 mg/dL (ref 0.20–1.20)
Total Protein: 6.6 g/dL (ref 6.4–8.3)

## 2011-09-24 LAB — CBC WITH DIFFERENTIAL/PLATELET
BASO%: 0.5 % (ref 0.0–2.0)
Basophils Absolute: 0 10*3/uL (ref 0.0–0.1)
EOS%: 0.4 % (ref 0.0–7.0)
HCT: 37.4 % (ref 34.8–46.6)
HGB: 12.4 g/dL (ref 11.6–15.9)
LYMPH%: 39 % (ref 14.0–49.7)
MCH: 30.3 pg (ref 25.1–34.0)
MCV: 91.6 fL (ref 79.5–101.0)
NEUT%: 50 % (ref 38.4–76.8)
lymph#: 1.3 10*3/uL (ref 0.9–3.3)

## 2011-09-24 LAB — CHCC SMEAR

## 2011-09-24 NOTE — Progress Notes (Signed)
[facility]  Telephone:([phone]) [phone] [phone]     INITIAL HEMATOLOGY CONSULTATION    Referral MD:  Dr. Creola Corn, M.D.  Reason for Referral: leukocytopenia, neutropenia.     HPI: [name] is a 36 year old woman with no significant past medical history.  She has routine follow up with Dr. Timothy Lasso and a CBC was obtained on 08/30/2011 which showed WBC 2.8; ANC 1.2; Hgb 12.9; Plt 223.  Of note, the previous CBC on 09/15/2011 showed WBC 4.1; ANC 2.1; Hgb 12; Plt 240.  Given the leukopenia, neutropenia, she was kindly referred to the [facility] for evaluation.   [name] presented to the clinic for the first time today by herself.  She reports feeling well.  She has tried to lose weight by working out more; and has lost almost 20-lb over the last 6 months.  She has chronic mild fatigue; however, she is still working full time as a Firefighter at [employer].  She denied infection around 08/30/2011 when she had the CBC drawn that showed leukopenia/neutropenia. She has mild chronic back pain; her job involves heavy lifting.  She denied fever, headache, mucositis, nausea/vomiting, SOB, chest pain, cough, recurrent infection, visible source of bleeding, diarrhea, dysuria, pyuria, skin rash, abscess.   The rest of the 14-point review of system was negative.    Past Medical History  Diagnosis Date  . Obesity   . Acne     she has not been taking med for this (including Accutane)   :    Past Surgical History  Procedure Date  . Cesarean section   :   CURRENT MEDS: No current outpatient prescriptions on file.      No Known Allergies:  History reviewed. No pertinent family history.:  History   Social History  . Marital Status: Married    Spouse Name: N/A    Number of Children: 2  . Years of Education: N/A   Occupational History  .  [employer]    nurse aids at [employer]   Social History Main Topics  . Smoking status: Never Smoker     . Smokeless tobacco: Never Used  . Alcohol Use: No  . Drug Use: No  . Sexually Active:    Other Topics Concern  . Not on file   Social History Narrative  . No narrative on file  :  REVIEW OF SYSTEM:  The rest of the 14-point review of sytem was negative.   Exam: ECOG 0.   General:  well-nourished woman, in no acute distress.  Eyes:  no scleral icterus.  ENT:  There were no oropharyngeal lesions.  Neck was without thyromegaly.  Lymphatics:  Negative cervical, supraclavicular or axillary adenopathy.  Respiratory: lungs were clear bilaterally without wheezing or crackles.  Cardiovascular:  Regular rate and rhythm, S1/S2, without murmur, rub or gallop.  There was no pedal edema.  GI:  abdomen was soft, flat, nontender, nondistended, without organomegaly.  Muscoloskeletal:  no spinal tenderness of palpation of vertebral spine.  Skin exam was without echymosis, petichae.  Neuro exam was nonfocal.  Patient was able to get on and off exam table without assistance.  Gait was normal.  Patient was alerted and oriented.  Attention was good.   Language was appropriate.  Mood was normal without depression.  Speech was not pressured.  Thought content was not tangential.    LABS:  Lab Results  Component Value Date   WBC 3.4*  09/24/2011   HGB 12.4 09/24/2011   HCT 37.4 09/24/2011   PLT 181 09/24/2011   GLUCOSE 99 06/28/2007   ALT 18 06/28/2007   AST 35 06/28/2007   NA 140 06/28/2007   K 3.7 06/28/2007   CL 109 06/28/2007   CREATININE 0.86 06/28/2007   BUN 5* 06/28/2007   CO2 26 06/28/2007    Blood smear review:   I personally reviewed the patient's peripheral blood smear today.  There was isocytosis.  There was no peripheral blast.  There was no schistocytosis, spherocytosis, target cell, rouleaux formation, tear drop cell.  There was no giant platelets or platelet clumps.     ASSESSMENT AND PLAN:   1.  Leukopenia:    -  Differential:  Most likely reactive to a recent occult infection.  Her WBC  improved today.  There was no neutropenia. My review of your blood smear today did not show evidence of leukemia.  - work up/follow up:  Lab only appointment at the [facility] in about 3, 6, and 9months.  Return visit in about 1 year.  In the future, if your WBC and neutrophil significantly worsened, or you developed also anemia and low platelet count, then further work up may be considered.   I advised her to contact us if she has recurrent infection, bleeding, severe fatigue; or non intentional weight loss.   2.  Fatigue:  I sent for TSH to rule our hypothyroidism.   Thank you for this referral.    The length of time of the face-to-face encounter was 30 minutes. More than 50% of time was spent counseling and coordination of care.

## 2011-09-24 NOTE — Telephone Encounter (Signed)
Gave patient appt calendar for 1 year, labs and MD for 2014

## 2011-09-24 NOTE — Progress Notes (Signed)
Checked in new pt w/ no financial concerns. °

## 2011-09-24 NOTE — Patient Instructions (Addendum)
1.  Issue:  Low WBC (leukoepenia):  Most likely reactive to a recent infection.  Your WBC has improved today.  My review of your blood smear today did not show evidence of leukemia.  2.  Follow up:  Lab only appointment in about 3, 6, and 9months.  Return visit in about 1 year.  In the future, if your WBC and neutrophil significantly worsened, or you developed also anemia and low platelet count, then further work up may be considered.

## 2011-09-25 LAB — HIV ANTIBODY (ROUTINE TESTING W REFLEX): HIV: NONREACTIVE

## 2011-09-25 LAB — TSH: TSH: 1.839 u[IU]/mL (ref 0.350–4.500)

## 2011-09-25 LAB — VITAMIN B12: Vitamin B-12: 481 pg/mL (ref 211–911)

## 2011-09-25 LAB — ANA: Anti Nuclear Antibody(ANA): NEGATIVE

## 2011-12-24 ENCOUNTER — Other Ambulatory Visit: Payer: 59 | Admitting: Lab

## 2012-03-24 ENCOUNTER — Other Ambulatory Visit: Payer: 59 | Admitting: Lab

## 2012-06-23 ENCOUNTER — Other Ambulatory Visit: Payer: 59 | Admitting: Lab

## 2012-07-25 ENCOUNTER — Other Ambulatory Visit (HOSPITAL_BASED_OUTPATIENT_CLINIC_OR_DEPARTMENT_OTHER): Payer: 59

## 2012-07-25 DIAGNOSIS — D709 Neutropenia, unspecified: Secondary | ICD-10-CM

## 2012-07-25 DIAGNOSIS — D72819 Decreased white blood cell count, unspecified: Secondary | ICD-10-CM

## 2012-07-25 LAB — CBC WITH DIFFERENTIAL/PLATELET
Basophils Absolute: 0 10*3/uL (ref 0.0–0.1)
Eosinophils Absolute: 0 10*3/uL (ref 0.0–0.5)
HCT: 34.4 % — ABNORMAL LOW (ref 34.8–46.6)
HGB: 11.7 g/dL (ref 11.6–15.9)
LYMPH%: 41.3 % (ref 14.0–49.7)
MCV: 90.5 fL (ref 79.5–101.0)
MONO%: 9 % (ref 0.0–14.0)
NEUT#: 1.7 10*3/uL (ref 1.5–6.5)
NEUT%: 48.7 % (ref 38.4–76.8)
Platelets: 177 10*3/uL (ref 145–400)
RBC: 3.8 10*6/uL (ref 3.70–5.45)

## 2012-07-31 ENCOUNTER — Encounter: Payer: Self-pay | Admitting: Oncology

## 2012-08-07 ENCOUNTER — Encounter (HOSPITAL_COMMUNITY): Payer: Self-pay | Admitting: Emergency Medicine

## 2012-08-07 ENCOUNTER — Emergency Department (HOSPITAL_COMMUNITY)
Admission: EM | Admit: 2012-08-07 | Discharge: 2012-08-07 | Disposition: A | Discharge status: Home / Self Care | Payer: 59 | Source: Home / Self Care

## 2012-08-07 DIAGNOSIS — J309 Allergic rhinitis, unspecified: Secondary | ICD-10-CM

## 2012-08-07 MED ORDER — PHENYLEPHRINE-CHLORPHEN-DM 10-4-12.5 MG/5ML PO LIQD: 5.0000 mL | ORAL | Status: DC | PRN

## 2012-08-07 MED ORDER — FLUTICASONE PROPIONATE 50 MCG/ACT NA SUSP: 2.0000 | Freq: Every day | NASAL | Status: DC

## 2012-08-07 NOTE — ED Notes (Signed)
Reports onset of symptoms Tuesday evening.  Reports headache, sore throat, body aches, and nasal and chest congestion.

## 2012-08-07 NOTE — ED Provider Notes (Signed)
History    CSN: [id number] Arrival date & time 08/07/12  4098  First MD Initiated Contact with Patient 08/07/12 [phone]     Chief Complaint  Patient presents with  . URI   (Consider location/radiation/quality/duration/timing/severity/associated sxs/prior Treatment) HPI Comments: 37 year old female presents with headache, sore throat, bodyaches, nasal congestion, PND for 2 days. She has had a cough that D. OTC medication DayQuil and self with that. She denies known fevers.  Past Medical History  Diagnosis Date  . Obesity   . Acne     she has not been taking med for this (including Accutane)    Past Surgical History  Procedure Laterality Date  . Cesarean section     No family history on file. History  Substance Use Topics  . Smoking status: Never Smoker   . Smokeless tobacco: Never Used  . Alcohol Use: No   OB History   Grav Para Term Preterm Abortions TAB SAB Ect Mult Living                 Review of Systems  Constitutional: Positive for activity change, appetite change and fatigue. Negative for fever.  HENT: Positive for congestion, sore throat, rhinorrhea and postnasal drip. Negative for ear pain, neck pain, neck stiffness, sinus pressure and ear discharge.   Respiratory: Positive for cough. Negative for chest tightness and shortness of breath.   Cardiovascular: Negative.   Gastrointestinal: Negative.   Genitourinary: Negative.   Musculoskeletal: Positive for myalgias. Negative for back pain and arthralgias.  Skin: Negative for rash.    Allergies  Review of patient's allergies indicates no known allergies.  Home Medications   Current Outpatient Rx  Name  Route  Sig  Dispense  Refill  . ibuprofen (ADVIL,MOTRIN) 200 MG tablet   Oral   Take 200 mg by mouth every 6 (six) hours as needed for pain.         . Pseudoeph-Doxylamine-DM-APAP (NYQUIL PO)   Oral   Take by mouth.         . Pseudoephedrine-APAP-DM (DAYQUIL PO)   Oral   Take by mouth.         .  fluticasone (FLONASE) 50 MCG/ACT nasal spray   Nasal   Place 2 sprays into the nose daily.   16 g   1   . Phenylephrine-Chlorphen-DM 10-19-10.5 MG/5ML LIQD   Oral   Take 5 mLs by mouth every 4 (four) hours as needed.   120 mL   0    BP 131/79  Pulse 78  Temp(Src) 99.5 F (37.5 C) (Oral)  Resp 16  SpO2 100%  LMP 07/06/2012 Physical Exam  Constitutional: She is oriented to person, place, and time. She appears well-developed and well-nourished. No distress.  HENT:  Right Ear: External ear normal.  Left Ear: External ear normal.  Mouth/Throat: No oropharyngeal exudate.  OP with minor erythema, cobblestoning and clear PND. No swelling or exudate. Airway widely patent.  Neck: Normal range of motion. Neck supple.  Cardiovascular: Normal rate, regular rhythm and normal heart sounds.   Pulmonary/Chest: Effort normal and breath sounds normal. No respiratory distress. She has no wheezes.  Musculoskeletal: Normal range of motion. She exhibits no edema.  Lymphadenopathy:    She has no cervical adenopathy.  Neurological: She is alert and oriented to person, place, and time.  Skin: Skin is warm and dry. No rash noted.  Psychiatric: She has a normal mood and affect.    ED Course  Procedures (including critical care time)  Labs Reviewed - No data to display No results found. 1. Allergic rhinitis     MDM  URI vs Allergic rhinitis; sx's similar although she has malaise and myalgias. Low grade fever. Tylenol q 4h prn Plenty of fluids Rapid strep neg. This was ordered at the time of D/C and increased her time in dept for a few minutes.  Hayden Rasmussen, NP 08/07/12 [phone]

## 2012-08-07 NOTE — ED Provider Notes (Signed)
Medical screening examination/treatment/procedure(s) were performed by a resident physician or non-physician practitioner and as the supervising physician I was immediately available for consultation/collaboration.  Lionell Matuszak, MD   [facility] Circe Chilton, MD 08/07/12 1753 

## 2012-08-07 NOTE — ED Notes (Signed)
No instructions available 

## 2012-09-19 ENCOUNTER — Telehealth: Payer: Self-pay | Admitting: Hematology and Oncology

## 2012-09-19 NOTE — Telephone Encounter (Signed)
CALLED PT TO R/S 9/9 APPT DUE TO [place] OUT. PER PT CX FOR NOW AND SHE WILL CALL BACK TO R/S WHEN SHE HAS HER October SCHEDULE.

## 2012-09-23 ENCOUNTER — Other Ambulatory Visit: Payer: 59 | Admitting: Lab

## 2012-09-23 ENCOUNTER — Ambulatory Visit: Payer: 59 | Admitting: Oncology

## 2014-07-13 ENCOUNTER — Encounter (HOSPITAL_COMMUNITY): Payer: Self-pay | Admitting: Emergency Medicine

## 2014-07-13 ENCOUNTER — Emergency Department (HOSPITAL_COMMUNITY)
Admission: EM | Admit: 2014-07-13 | Discharge: 2014-07-13 | Disposition: A | Discharge status: Home / Self Care | Payer: 59 | Source: Home / Self Care | Attending: Family Medicine | Admitting: Family Medicine

## 2014-07-13 DIAGNOSIS — M2580 Other specified joint disorders, unspecified joint: Secondary | ICD-10-CM

## 2014-07-13 MED ORDER — DICLOFENAC POTASSIUM 50 MG PO TABS: 50.0000 mg | ORAL_TABLET | Freq: Three times a day (TID) | ORAL | Status: DC

## 2014-07-13 NOTE — ED Notes (Signed)
Left great toe pain that started 6/26.  No known injury.  Pain includes left great toe and area of the foot directly behind toe.  No history of this kind of pain.  Area involved looks slightly red, slightly swollen

## 2014-07-13 NOTE — ED Provider Notes (Signed)
CSN: [id number]     Arrival date & time 07/13/14  1828 History   First MD Initiated Contact with Patient 07/13/14 1925     Chief Complaint  Patient presents with  . Toe Pain   (Consider location/radiation/quality/duration/timing/severity/associated sxs/prior Treatment) Patient is a 39 y.o. female presenting with toe pain. The history is provided by the patient.  Toe Pain This is a new problem. The current episode started 2 days ago. The problem has been gradually worsening. The symptoms are aggravated by walking.    Past Medical History  Diagnosis Date  . Obesity   . Acne     she has not been taking med for this (including Accutane)    Past Surgical History  Procedure Laterality Date  . Cesarean section    . Index finger     No family history on file. History  Substance Use Topics  . Smoking status: Never Smoker   . Smokeless tobacco: Never Used  . Alcohol Use: No   OB History    No data available     Review of Systems  Constitutional: Negative.   Musculoskeletal: Positive for gait problem. Negative for back pain and joint swelling.  Skin: Negative.     Allergies  Review of patient's allergies indicates no known allergies.  Home Medications   Prior to Admission medications   Medication Sig Start Date End Date Taking? Authorizing Provider  diclofenac (CATAFLAM) 50 MG tablet Take 1 tablet (50 mg total) by mouth 3 (three) times daily. Prn pain 07/13/14   Billy Fischer, MD  fluticasone (FLONASE) 50 MCG/ACT nasal spray Place 2 sprays into the nose daily. 08/07/12   Janne Napoleon, NP  ibuprofen (ADVIL,MOTRIN) 200 MG tablet Take 200 mg by mouth every 6 (six) hours as needed for pain.    Historical Provider, MD  Phenylephrine-Chlorphen-DM 10-19-10.5 MG/5ML LIQD Take 5 mLs by mouth every 4 (four) hours as needed. 08/07/12   Janne Napoleon, NP  Pseudoeph-Doxylamine-DM-APAP (NYQUIL PO) Take by mouth.    Historical Provider, MD  Pseudoephedrine-APAP-DM (DAYQUIL PO) Take by mouth.     Historical Provider, MD   BP 138/86 mmHg  Pulse 93  Temp(Src) 99 F (37.2 C) (Oral)  Resp 12  SpO2 99%  LMP 07/07/2014 Physical Exam  Constitutional: She is oriented to person, place, and time. She appears well-developed and well-nourished.  Musculoskeletal: She exhibits tenderness.       Left foot: There is tenderness. There is normal range of motion and no swelling.       Feet:  Neurological: She is alert and oriented to person, place, and time.  Skin: Skin is warm and dry.  Nursing note and vitals reviewed.   ED Course  Procedures (including critical care time) Labs Review Labs Reviewed - No data to display  Imaging Review No results found.   MDM   1. Sesamoiditis        Billy Fischer, MD 07/13/14 2046

## 2014-07-13 NOTE — Discharge Instructions (Signed)
Ice and medicine and cushioned shoe for comfort.

## 2014-08-20 ENCOUNTER — Encounter (HOSPITAL_BASED_OUTPATIENT_CLINIC_OR_DEPARTMENT_OTHER): Payer: Self-pay | Admitting: Emergency Medicine

## 2014-08-20 ENCOUNTER — Emergency Department (HOSPITAL_BASED_OUTPATIENT_CLINIC_OR_DEPARTMENT_OTHER)
Admission: EM | Admit: 2014-08-20 | Discharge: 2014-08-20 | Disposition: A | Discharge status: Home / Self Care | Payer: 59 | Attending: Emergency Medicine | Admitting: Emergency Medicine

## 2014-08-20 DIAGNOSIS — Z791 Long term (current) use of non-steroidal anti-inflammatories (NSAID): Secondary | ICD-10-CM | POA: Diagnosis not present

## 2014-08-20 DIAGNOSIS — N390 Urinary tract infection, site not specified: Secondary | ICD-10-CM | POA: Insufficient documentation

## 2014-08-20 DIAGNOSIS — Z7951 Long term (current) use of inhaled steroids: Secondary | ICD-10-CM | POA: Diagnosis not present

## 2014-08-20 DIAGNOSIS — Z3202 Encounter for pregnancy test, result negative: Secondary | ICD-10-CM | POA: Insufficient documentation

## 2014-08-20 DIAGNOSIS — Z872 Personal history of diseases of the skin and subcutaneous tissue: Secondary | ICD-10-CM | POA: Diagnosis not present

## 2014-08-20 DIAGNOSIS — E669 Obesity, unspecified: Secondary | ICD-10-CM | POA: Diagnosis not present

## 2014-08-20 DIAGNOSIS — R350 Frequency of micturition: Secondary | ICD-10-CM | POA: Diagnosis present

## 2014-08-20 LAB — URINE MICROSCOPIC-ADD ON

## 2014-08-20 LAB — URINALYSIS, ROUTINE W REFLEX MICROSCOPIC
BILIRUBIN URINE: NEGATIVE
GLUCOSE, UA: NEGATIVE mg/dL
Ketones, ur: NEGATIVE mg/dL
Nitrite: POSITIVE — AB
PH: 5.5 (ref 5.0–8.0)
Protein, ur: NEGATIVE mg/dL
SPECIFIC GRAVITY, URINE: 1.022 (ref 1.005–1.030)
Urobilinogen, UA: 0.2 mg/dL (ref 0.0–1.0)

## 2014-08-20 LAB — PREGNANCY, URINE: PREG TEST UR: NEGATIVE

## 2014-08-20 MED ORDER — SULFAMETHOXAZOLE-TRIMETHOPRIM 800-160 MG PO TABS: 1.0000 | ORAL_TABLET | Freq: Two times a day (BID) | ORAL | Status: AC

## 2014-08-20 NOTE — ED Notes (Signed)
Pt in c/o frequency, dysuria, and pelvic pressure when voiding x 2 days.

## 2014-08-23 LAB — URINE CULTURE: Culture: >=100000

## 2014-08-24 ENCOUNTER — Telehealth (HOSPITAL_COMMUNITY): Payer: Self-pay | Admitting: Emergency Medicine

## 2014-08-24 NOTE — Telephone Encounter (Signed)
Post ED Visit - Positive Culture Follow-up  Culture report reviewed by antimicrobial stewardship pharmacist: []  Sundra Aland, Pharm.D., BCPS []  Heide Guile, Pharm.D., BCPS []  Alycia Rossetti, Pharm.D., BCPS []  Reynolds, Pharm.D., BCPS, AAHIVP []  Legrand Como, Pharm.D., BCPS, AAHIVP [x]  Dimitri Ped, Pharm.D., BCPS  Positive Urine culture Treated with Sulfa-Trimeth, organism sensitive to the same and no further patient follow-up is required at this time.  Ernesta Amble 08/24/2014, 11:32 AM

## 2014-09-25 NOTE — ED Provider Notes (Signed)
CSN: [id number]     Arrival date & time 08/20/14  1952 History   First MD Initiated Contact with Patient 08/20/14 2106     Chief Complaint  Patient presents with  . Urinary Frequency  . Pelvic Pain     (Consider location/radiation/quality/duration/timing/severity/associated sxs/prior Treatment) Patient is a 39 y.o. female presenting with frequency and pelvic pain.  Urinary Frequency This is a new problem. The current episode started 2 days ago. The problem occurs constantly. The problem has not changed since onset.Pertinent negatives include no chest pain, no abdominal pain (suprapubic when voiding), no headaches and no shortness of breath. Nothing aggravates the symptoms. Nothing relieves the symptoms. She has tried nothing for the symptoms. The treatment provided no relief.  Pelvic Pain Pertinent negatives include no chest pain, no abdominal pain (suprapubic when voiding), no headaches and no shortness of breath.    Past Medical History  Diagnosis Date  . Obesity   . Acne     she has not been taking med for this (including Accutane)    Past Surgical History  Procedure Laterality Date  . Cesarean section    . Index finger     History reviewed. No pertinent family history. Social History  Substance Use Topics  . Smoking status: Never Smoker   . Smokeless tobacco: Never Used  . Alcohol Use: No   OB History    No data available     Review of Systems  Constitutional: Negative for fever.  HENT: Negative for sore throat.   Eyes: Negative for visual disturbance.  Respiratory: Negative for cough and shortness of breath.   Cardiovascular: Negative for chest pain.  Gastrointestinal: Negative for nausea, vomiting, abdominal pain (suprapubic when voiding), diarrhea and constipation.  Genitourinary: Positive for dysuria and frequency. Negative for vaginal bleeding, vaginal discharge, difficulty urinating and pelvic pain.  Musculoskeletal: Negative for back pain and neck pain.   Skin: Negative for rash.  Neurological: Negative for syncope and headaches.      Allergies  Review of patient's allergies indicates no known allergies.  Home Medications   Prior to Admission medications   Medication Sig Start Date End Date Taking? Authorizing Provider  diclofenac (CATAFLAM) 50 MG tablet Take 1 tablet (50 mg total) by mouth 3 (three) times daily. Prn pain 07/13/14   Billy Fischer, MD  fluticasone (FLONASE) 50 MCG/ACT nasal spray Place 2 sprays into the nose daily. 08/07/12   Janne Napoleon, NP  ibuprofen (ADVIL,MOTRIN) 200 MG tablet Take 200 mg by mouth every 6 (six) hours as needed for pain.    Historical Provider, MD  Phenylephrine-Chlorphen-DM 10-19-10.5 MG/5ML LIQD Take 5 mLs by mouth every 4 (four) hours as needed. 08/07/12   Janne Napoleon, NP  Pseudoeph-Doxylamine-DM-APAP (NYQUIL PO) Take by mouth.    Historical Provider, MD  Pseudoephedrine-APAP-DM (DAYQUIL PO) Take by mouth.    Historical Provider, MD   BP 121/81 mmHg  Pulse 78  Temp(Src) 98.8 F (37.1 C) (Oral)  Resp 18  Ht 5\' 4"  (1.626 m)  Wt 153 lb (69.4 kg)  BMI 26.25 kg/m2  SpO2 100%  LMP 07/30/2014 Physical Exam  Constitutional: She is oriented to person, place, and time. She appears well-developed and well-nourished. No distress.  HENT:  Head: Normocephalic and atraumatic.  Eyes: Conjunctivae and EOM are normal.  Neck: Normal range of motion.  Cardiovascular: Normal rate, regular rhythm, normal heart sounds and intact distal pulses.  Exam reveals no gallop and no friction rub.   No murmur heard. Pulmonary/Chest: Effort  normal and breath sounds normal. No respiratory distress. She has no wheezes. She has no rales.  Abdominal: Soft. She exhibits no distension. There is no tenderness. There is no guarding and no CVA tenderness.  Musculoskeletal: She exhibits no edema or tenderness.  Neurological: She is alert and oriented to person, place, and time.  Skin: Skin is warm and dry. No rash noted. She is not  diaphoretic. No erythema.  Nursing note and vitals reviewed.   ED Course  Procedures (including critical care time) Labs Review Labs Reviewed  URINALYSIS, ROUTINE W REFLEX MICROSCOPIC (NOT AT [facility]) - Abnormal; Notable for the following:    APPearance CLOUDY (*)    Hgb urine dipstick TRACE (*)    Nitrite POSITIVE (*)    Leukocytes, UA MODERATE (*)    All other components within normal limits  URINE MICROSCOPIC-ADD ON - Abnormal; Notable for the following:    Squamous Epithelial / LPF FEW (*)    Bacteria, UA MANY (*)    All other components within normal limits  URINE CULTURE  PREGNANCY, URINE    Imaging Review No results found. I have personally reviewed and evaluated these images and lab results as part of my medical decision-making.   EKG Interpretation None      MDM   Final diagnoses:  UTI (lower urinary tract infection)   39yo female with no significant medical history presents with concern for dysuria, frequency for 2 days.  Urinalysis concerning for UTI.  No flank pain, no n/v, no fevers.  No vaginal discharge and doubt pelvic infxn as cause of symptoms at this time. Well appearing, normal VS.  Given rx for bactrim and reasons to return. Patient discharged in stable condition with understanding of reasons to return.    Gareth Morgan, MD 09/25/14 1436

## 2015-01-21 MED FILL — FLUCONAZOLE 150 MG TABLET: 150 | 1 days supply | Qty: 1 | Fill #0

## 2015-03-09 MED FILL — LARIN 24 FE 1 MG-20 MCG TAB: 1-20 | 84 days supply | Qty: 84 | Fill #1

## 2015-04-18 DIAGNOSIS — Z114 Encounter for screening for human immunodeficiency virus [HIV]: Secondary | ICD-10-CM | POA: Diagnosis not present

## 2015-04-18 DIAGNOSIS — Z113 Encounter for screening for infections with a predominantly sexual mode of transmission: Secondary | ICD-10-CM | POA: Diagnosis not present

## 2015-04-18 DIAGNOSIS — Z1159 Encounter for screening for other viral diseases: Secondary | ICD-10-CM | POA: Diagnosis not present

## 2015-04-18 DIAGNOSIS — Z118 Encounter for screening for other infectious and parasitic diseases: Secondary | ICD-10-CM | POA: Diagnosis not present

## 2015-04-18 DIAGNOSIS — N760 Acute vaginitis: Secondary | ICD-10-CM | POA: Diagnosis not present

## 2015-04-18 DIAGNOSIS — B373 Candidiasis of vulva and vagina: Secondary | ICD-10-CM | POA: Diagnosis not present

## 2015-04-18 MED FILL — TINIDAZOLE 500 MG TABLET: 500 | 2 days supply | Qty: 8 | Fill #0

## 2015-04-18 MED FILL — FLUCONAZOLE 150 MG TABLET: 150 | 2 days supply | Qty: 2 | Fill #0

## 2015-05-30 MED FILL — LARIN 24 FE 1 MG-20 MCG TAB: 1-20 | 84 days supply | Qty: 84 | Fill #2

## 2015-08-19 MED FILL — LARIN 24 FE 1 MG-20 MCG TAB: 1-20 | 84 days supply | Qty: 84 | Fill #3

## 2015-09-23 ENCOUNTER — Emergency Department (HOSPITAL_COMMUNITY): Payer: PRIVATE HEALTH INSURANCE

## 2015-09-23 ENCOUNTER — Encounter (HOSPITAL_COMMUNITY): Payer: Self-pay | Admitting: *Deleted

## 2015-09-23 ENCOUNTER — Emergency Department (HOSPITAL_COMMUNITY)
Admission: EM | Admit: 2015-09-23 | Discharge: 2015-09-23 | Disposition: A | Discharge status: Home / Self Care | Payer: PRIVATE HEALTH INSURANCE | Attending: Physician Assistant | Admitting: Physician Assistant

## 2015-09-23 DIAGNOSIS — M25572 Pain in left ankle and joints of left foot: Secondary | ICD-10-CM | POA: Insufficient documentation

## 2015-09-23 DIAGNOSIS — Z0389 Encounter for observation for other suspected diseases and conditions ruled out: Secondary | ICD-10-CM | POA: Diagnosis not present

## 2015-09-23 NOTE — ED Provider Notes (Signed)
[facility] DEPT Provider Note   CSN: [id number] Arrival date & time: 09/23/15  2029  By signing my name below, I, Shanna Cisco, attest that this documentation has been prepared under the direction and in the presence of Quincy Carnes, PA-C. Electronically signed by: Shanna Cisco, ED Scribe. 09/23/15. 9:35 PM.  History   Chief Complaint Chief Complaint  Patient presents with  . Ankle Pain   The history is provided by the patient. No language interpreter was used.   HPI Comments:  Patricia Mayo is a 40 y.o. female who presents to the Emergency Department complaining of left ankle pain, which started earlier today. Pt reports that she struck her ankle on the base of a metal stool. Pain is characterized as a throbbing sensation and is localized to top of foot and inner ankle. Associated symptoms include some difficulty walking secondary to pain and pain with flexing foot and moving foot from side to side. Pt has applied ice to the area throughout the day. No denials reported.   Past Medical History:  Diagnosis Date  . Acne    she has not been taking med for this (including Accutane)   . Obesity     Patient Active Problem List   Diagnosis Date Noted  . Neutropenia ([place])   . Leukopenia     Past Surgical History:  Procedure Laterality Date  . CESAREAN SECTION    . index finger      OB History    No data available       Home Medications    Prior to Admission medications   Medication Sig Start Date End Date Taking? Authorizing Provider  diclofenac (CATAFLAM) 50 MG tablet Take 1 tablet (50 mg total) by mouth 3 (three) times daily. Prn pain 07/13/14   Billy Fischer, MD  fluticasone (FLONASE) 50 MCG/ACT nasal spray Place 2 sprays into the nose daily. 08/07/12   Janne Napoleon, NP  ibuprofen (ADVIL,MOTRIN) 200 MG tablet Take 200 mg by mouth every 6 (six) hours as needed for pain.    Historical Provider, MD  Phenylephrine-Chlorphen-DM 10-19-10.5 MG/5ML LIQD Take 5 mLs by mouth every 4  (four) hours as needed. 08/07/12   Janne Napoleon, NP  Pseudoeph-Doxylamine-DM-APAP (NYQUIL PO) Take by mouth.    Historical Provider, MD  Pseudoephedrine-APAP-DM (DAYQUIL PO) Take by mouth.    Historical Provider, MD    Family History No family history on file.  Social History Social History  Substance Use Topics  . Smoking status: Never Smoker  . Smokeless tobacco: Never Used  . Alcohol use No     Allergies   Review of patient's allergies indicates no known allergies.   Review of Systems Review of Systems  Musculoskeletal: Positive for arthralgias ( left ankle) and myalgias.  All other systems reviewed and are negative.    Physical Exam Updated Vital Signs BP 137/93 (BP Location: Left Arm)   Pulse 86   Temp 98.9 F (37.2 C) (Oral)   Resp 18   Ht 5\' 4"  (1.626 m)   LMP 09/08/2015   SpO2 100%   Physical Exam  Constitutional: She is oriented to person, place, and time. She appears well-developed and well-nourished.  HENT:  Head: Normocephalic and atraumatic.  Mouth/Throat: Oropharynx is clear and moist.  Eyes: Conjunctivae and EOM are normal. Pupils are equal, round, and reactive to light.  Neck: Normal range of motion.  Cardiovascular: Normal rate, regular rhythm and normal heart sounds.   Pulmonary/Chest: Effort normal and breath sounds normal.  Abdominal: Soft. Bowel sounds are normal.  Musculoskeletal: Normal range of motion.  Left ankle overall normal in appearance without swelling or bony deformity, some mild tenderness noted along the medial/anterior aspect; no bruising or open wounds; DP pulse intact, normal cap refill, normal sensation; ambulatory with steady gait  Neurological: She is alert and oriented to person, place, and time.  Skin: Skin is warm and dry.  Psychiatric: She has a normal mood and affect.  Nursing note and vitals reviewed.    ED Treatments / Results  DIAGNOSTIC STUDIES:  Oxygen Saturation is 100% on room air, normal by my  interpretation.    COORDINATION OF CARE:  9:32 PM Discussed treatment plan with pt at bedside, which includes x-ray of ankle, and pt agreed to plan.  Labs (all labs ordered are listed, but only abnormal results are displayed) Labs Reviewed - No data to display  EKG  EKG Interpretation None       Radiology No results found.  Procedures Procedures (including critical care time)  Medications Ordered in ED Medications - No data to display   Initial Impression / Assessment and Plan / ED Course  I have reviewed the triage vital signs and the nursing notes.  Pertinent labs & imaging results that were available during my care of the patient were reviewed by me and considered in my medical decision making (see chart for details).  Clinical Course   40 year old female here with left ankle pain after she was struck with a metal stool. There is no gross deformity noted on exam. Her foot is neurovascularly intact. Screening x-ray obtained, no acute bony findings. Ace wrap was applied for comfort. Discharge home with supportive care. Follow-up with PCP.  Discussed plan with patient, she acknowledged understanding and agreed with plan of care.  Return precautions given for new or worsening symptoms.  Final Clinical Impressions(s) / ED Diagnoses   Final diagnoses:  Ankle pain, left    New Prescriptions New Prescriptions   No medications on file   I personally performed the services described in this documentation, which was scribed in my presence. The recorded information has been reviewed and is accurate.    Larene Pickett, PA-C 09/23/15 [place], MD 09/23/15 [phone]

## 2015-09-23 NOTE — Discharge Instructions (Signed)
Recommend tylenol or motrin for pain.  Ice and elevate to help as well. Follow-up with your primary care doctor. Return here for new concerns.

## 2015-09-23 NOTE — ED Triage Notes (Signed)
The pt is c/o pain in her rt ankle she struck it on the base of a stool earlier today  lmp aug 25th

## 2015-09-23 NOTE — ED Notes (Signed)
Patient transported to X-ray 

## 2015-10-04 DIAGNOSIS — Z0000 Encounter for general adult medical examination without abnormal findings: Secondary | ICD-10-CM | POA: Diagnosis not present

## 2015-10-07 DIAGNOSIS — Z1389 Encounter for screening for other disorder: Secondary | ICD-10-CM | POA: Diagnosis not present

## 2015-10-07 DIAGNOSIS — R51 Headache: Secondary | ICD-10-CM | POA: Diagnosis not present

## 2015-10-07 DIAGNOSIS — L708 Other acne: Secondary | ICD-10-CM | POA: Diagnosis not present

## 2015-10-07 DIAGNOSIS — F418 Other specified anxiety disorders: Secondary | ICD-10-CM | POA: Diagnosis not present

## 2015-10-07 DIAGNOSIS — Z0000 Encounter for general adult medical examination without abnormal findings: Secondary | ICD-10-CM | POA: Diagnosis not present

## 2015-10-07 DIAGNOSIS — E663 Overweight: Secondary | ICD-10-CM | POA: Diagnosis not present

## 2015-10-07 DIAGNOSIS — Z6827 Body mass index (BMI) 27.0-27.9, adult: Secondary | ICD-10-CM | POA: Diagnosis not present

## 2015-10-07 DIAGNOSIS — D72819 Decreased white blood cell count, unspecified: Secondary | ICD-10-CM | POA: Diagnosis not present

## 2015-11-09 MED FILL — LARIN 24 FE 1 MG-20 MCG TAB: 1-20 | 28 days supply | Qty: 28 | Fill #0

## 2015-11-29 MED FILL — LARIN 24 FE 1 MG-20 MCG TAB: 1-20 | 28 days supply | Qty: 28 | Fill #0

## 2015-11-29 MED FILL — FLUCONAZOLE 150 MG TABLET: 150 | 4 days supply | Qty: 2 | Fill #0

## 2016-01-02 MED FILL — LARIN 24 FE 1 MG-20 MCG TAB: 1-20 | 28 days supply | Qty: 28 | Fill #0

## 2016-01-19 DIAGNOSIS — Z118 Encounter for screening for other infectious and parasitic diseases: Secondary | ICD-10-CM | POA: Diagnosis not present

## 2016-01-19 DIAGNOSIS — B373 Candidiasis of vulva and vagina: Secondary | ICD-10-CM | POA: Diagnosis not present

## 2016-01-19 DIAGNOSIS — Z113 Encounter for screening for infections with a predominantly sexual mode of transmission: Secondary | ICD-10-CM | POA: Diagnosis not present

## 2016-01-19 DIAGNOSIS — Z7251 High risk heterosexual behavior: Secondary | ICD-10-CM | POA: Diagnosis not present

## 2016-01-19 MED FILL — FLUCONAZOLE 150 MG TABLET: 150 | 2 days supply | Qty: 2 | Fill #0

## 2016-01-30 MED FILL — LARIN 24 FE 1 MG-20 MCG TAB: 1-20 | 28 days supply | Qty: 28 | Fill #0

## 2016-02-24 MED FILL — LARIN 24 FE 1 MG-20 MCG TAB: 1-20 | 28 days supply | Qty: 28 | Fill #0

## 2016-03-01 DIAGNOSIS — Z01419 Encounter for gynecological examination (general) (routine) without abnormal findings: Secondary | ICD-10-CM | POA: Diagnosis not present

## 2016-03-01 DIAGNOSIS — Z6828 Body mass index (BMI) 28.0-28.9, adult: Secondary | ICD-10-CM | POA: Diagnosis not present

## 2016-03-01 DIAGNOSIS — Z1151 Encounter for screening for human papillomavirus (HPV): Secondary | ICD-10-CM | POA: Diagnosis not present

## 2016-03-22 MED FILL — LARIN 24 FE 1 MG-20 MCG TAB: 1-20 | 84 days supply | Qty: 84 | Fill #0

## 2016-06-14 MED FILL — LARIN 24 FE 1 MG-20 MCG TAB: 1-20 | 84 days supply | Qty: 84 | Fill #1

## 2016-07-24 DIAGNOSIS — H5213 Myopia, bilateral: Secondary | ICD-10-CM | POA: Diagnosis not present

## 2016-09-03 MED FILL — LARIN 24 FE 1 MG-20 MCG TAB: 1-20 | 84 days supply | Qty: 84 | Fill #2

## 2016-10-04 DIAGNOSIS — Z0000 Encounter for general adult medical examination without abnormal findings: Secondary | ICD-10-CM | POA: Diagnosis not present

## 2016-10-08 DIAGNOSIS — Z0000 Encounter for general adult medical examination without abnormal findings: Secondary | ICD-10-CM | POA: Diagnosis not present

## 2016-10-08 DIAGNOSIS — E663 Overweight: Secondary | ICD-10-CM | POA: Diagnosis not present

## 2016-10-08 DIAGNOSIS — L708 Other acne: Secondary | ICD-10-CM | POA: Diagnosis not present

## 2016-10-08 DIAGNOSIS — F418 Other specified anxiety disorders: Secondary | ICD-10-CM | POA: Diagnosis not present

## 2016-10-08 DIAGNOSIS — D72819 Decreased white blood cell count, unspecified: Secondary | ICD-10-CM | POA: Diagnosis not present

## 2016-10-08 DIAGNOSIS — Z6828 Body mass index (BMI) 28.0-28.9, adult: Secondary | ICD-10-CM | POA: Diagnosis not present

## 2016-10-08 DIAGNOSIS — Z1389 Encounter for screening for other disorder: Secondary | ICD-10-CM | POA: Diagnosis not present

## 2016-11-26 MED FILL — BLISOVI 24 FE TABLET: 1-20 | 84 days supply | Qty: 84 | Fill #3

## 2017-01-14 DIAGNOSIS — D72819 Decreased white blood cell count, unspecified: Secondary | ICD-10-CM | POA: Diagnosis not present

## 2017-02-01 DIAGNOSIS — J029 Acute pharyngitis, unspecified: Secondary | ICD-10-CM | POA: Diagnosis not present

## 2017-02-01 DIAGNOSIS — R509 Fever, unspecified: Secondary | ICD-10-CM | POA: Diagnosis not present

## 2017-02-01 DIAGNOSIS — Z6829 Body mass index (BMI) 29.0-29.9, adult: Secondary | ICD-10-CM | POA: Diagnosis not present

## 2017-02-01 DIAGNOSIS — J329 Chronic sinusitis, unspecified: Secondary | ICD-10-CM | POA: Diagnosis not present

## 2017-02-01 MED FILL — CEFDINIR 300 MG CAPSULE: 300 | 10 days supply | Qty: 20 | Fill #0

## 2017-02-01 MED FILL — FLUTICASONE PROP 50 MCG SPR: 50 | 30 days supply | Qty: 16 | Fill #0

## 2017-02-15 MED FILL — BLISOVI 24 FE TABLET: 1-20 | 84 days supply | Qty: 84 | Fill #4

## 2017-05-07 MED FILL — BLISOVI 24 FE TABLET: 1-20 | 84 days supply | Qty: 84 | Fill #0

## 2017-07-25 MED FILL — BLISOVI 24 FE TABLET: 1-20 | 28 days supply | Qty: 28 | Fill #0

## 2017-07-30 DIAGNOSIS — N898 Other specified noninflammatory disorders of vagina: Secondary | ICD-10-CM | POA: Diagnosis not present

## 2017-07-30 DIAGNOSIS — Z1151 Encounter for screening for human papillomavirus (HPV): Secondary | ICD-10-CM | POA: Diagnosis not present

## 2017-07-30 DIAGNOSIS — Z118 Encounter for screening for other infectious and parasitic diseases: Secondary | ICD-10-CM | POA: Diagnosis not present

## 2017-07-30 DIAGNOSIS — Z01419 Encounter for gynecological examination (general) (routine) without abnormal findings: Secondary | ICD-10-CM | POA: Diagnosis not present

## 2017-07-30 DIAGNOSIS — Z1159 Encounter for screening for other viral diseases: Secondary | ICD-10-CM | POA: Diagnosis not present

## 2017-07-30 DIAGNOSIS — Z6830 Body mass index (BMI) 30.0-30.9, adult: Secondary | ICD-10-CM | POA: Diagnosis not present

## 2017-07-30 DIAGNOSIS — Z113 Encounter for screening for infections with a predominantly sexual mode of transmission: Secondary | ICD-10-CM | POA: Diagnosis not present

## 2017-07-30 DIAGNOSIS — Z114 Encounter for screening for human immunodeficiency virus [HIV]: Secondary | ICD-10-CM | POA: Diagnosis not present

## 2017-08-23 MED FILL — BLISOVI 24 FE TABLET: 1-20 | 84 days supply | Qty: 84 | Fill #0

## 2017-10-09 DIAGNOSIS — Z0000 Encounter for general adult medical examination without abnormal findings: Secondary | ICD-10-CM | POA: Diagnosis not present

## 2017-10-09 DIAGNOSIS — R82998 Other abnormal findings in urine: Secondary | ICD-10-CM | POA: Diagnosis not present

## 2017-10-10 DIAGNOSIS — Z0000 Encounter for general adult medical examination without abnormal findings: Secondary | ICD-10-CM | POA: Diagnosis not present

## 2017-10-10 DIAGNOSIS — E663 Overweight: Secondary | ICD-10-CM | POA: Diagnosis not present

## 2017-10-10 DIAGNOSIS — F418 Other specified anxiety disorders: Secondary | ICD-10-CM | POA: Diagnosis not present

## 2017-10-10 DIAGNOSIS — D72819 Decreased white blood cell count, unspecified: Secondary | ICD-10-CM | POA: Diagnosis not present

## 2017-10-10 DIAGNOSIS — Z1389 Encounter for screening for other disorder: Secondary | ICD-10-CM | POA: Diagnosis not present

## 2017-10-10 DIAGNOSIS — Z6830 Body mass index (BMI) 30.0-30.9, adult: Secondary | ICD-10-CM | POA: Diagnosis not present

## 2017-11-15 MED FILL — BLISOVI 24 FE TABLET: 1-20 | 84 days supply | Qty: 84 | Fill #1

## 2018-02-07 DIAGNOSIS — Z118 Encounter for screening for other infectious and parasitic diseases: Secondary | ICD-10-CM | POA: Diagnosis not present

## 2018-02-07 DIAGNOSIS — Z113 Encounter for screening for infections with a predominantly sexual mode of transmission: Secondary | ICD-10-CM | POA: Diagnosis not present

## 2018-02-07 DIAGNOSIS — Z114 Encounter for screening for human immunodeficiency virus [HIV]: Secondary | ICD-10-CM | POA: Diagnosis not present

## 2018-02-07 DIAGNOSIS — Z1159 Encounter for screening for other viral diseases: Secondary | ICD-10-CM | POA: Diagnosis not present

## 2018-02-07 DIAGNOSIS — N921 Excessive and frequent menstruation with irregular cycle: Secondary | ICD-10-CM | POA: Diagnosis not present

## 2018-02-07 MED FILL — NORLYDA 0.35 MG TABS: 0.35 | 28 days supply | Qty: 28 | Fill #0

## 2018-03-10 MED FILL — NORLYDA 0.35 MG TABS: 0.35 | 84 days supply | Qty: 84 | Fill #1 | Status: TO

## 2018-04-29 DIAGNOSIS — B373 Candidiasis of vulva and vagina: Secondary | ICD-10-CM | POA: Diagnosis not present

## 2018-04-29 DIAGNOSIS — N760 Acute vaginitis: Secondary | ICD-10-CM | POA: Diagnosis not present

## 2018-04-29 MED FILL — FLUCONAZOLE 150 MG TABS: 150 | 2 days supply | Qty: 2 | Fill #0

## 2018-04-29 MED FILL — TINIDAZOLE 500 MG TAB: 500 | 5 days supply | Qty: 10 | Fill #0

## 2018-06-02 MED FILL — NORETHINDRONE 0.35 MG TAB: 0.35 | 84 days supply | Qty: 84 | Fill #0

## 2018-06-18 DIAGNOSIS — H6691 Otitis media, unspecified, right ear: Secondary | ICD-10-CM | POA: Diagnosis not present

## 2018-06-18 DIAGNOSIS — H6120 Impacted cerumen, unspecified ear: Secondary | ICD-10-CM | POA: Diagnosis not present

## 2018-06-18 MED FILL — AMOX-CLAV 875-125 MG TABLET: 875-125 | 7 days supply | Qty: 14 | Fill #0

## 2018-08-25 MED FILL — NORLYDA 0.35 MG TABS: 0.35 | 84 days supply | Qty: 84 | Fill #0

## 2018-09-09 MED FILL — PHENTERMINE 37.5 MG TABLET: 37.5 | 35 days supply | Qty: 35 | Fill #0

## 2018-10-17 DIAGNOSIS — R82998 Other abnormal findings in urine: Secondary | ICD-10-CM | POA: Diagnosis not present

## 2018-10-20 DIAGNOSIS — F418 Other specified anxiety disorders: Secondary | ICD-10-CM | POA: Diagnosis not present

## 2018-10-20 DIAGNOSIS — D72819 Decreased white blood cell count, unspecified: Secondary | ICD-10-CM | POA: Diagnosis not present

## 2018-10-20 DIAGNOSIS — E668 Other obesity: Secondary | ICD-10-CM | POA: Diagnosis not present

## 2018-10-20 DIAGNOSIS — Z0000 Encounter for general adult medical examination without abnormal findings: Secondary | ICD-10-CM | POA: Diagnosis not present

## 2018-10-20 DIAGNOSIS — I781 Nevus, non-neoplastic: Secondary | ICD-10-CM | POA: Diagnosis not present

## 2018-10-20 DIAGNOSIS — R519 Headache, unspecified: Secondary | ICD-10-CM | POA: Diagnosis not present

## 2018-10-20 DIAGNOSIS — M542 Cervicalgia: Secondary | ICD-10-CM | POA: Diagnosis not present

## 2018-11-12 MED FILL — NORLYDA 0.35 MG TABS: 0.35 | 56 days supply | Qty: 56 | Fill #1

## 2019-01-13 ENCOUNTER — Ambulatory Visit (HOSPITAL_COMMUNITY)
Admission: EM | Admit: 2019-01-13 | Discharge: 2019-01-13 | Disposition: A | Discharge status: Home / Self Care | Payer: 59 | Attending: Family Medicine | Admitting: Family Medicine

## 2019-01-13 ENCOUNTER — Other Ambulatory Visit: Payer: Self-pay

## 2019-01-13 ENCOUNTER — Encounter (HOSPITAL_COMMUNITY): Payer: Self-pay | Admitting: Emergency Medicine

## 2019-01-13 DIAGNOSIS — R42 Dizziness and giddiness: Secondary | ICD-10-CM | POA: Diagnosis not present

## 2019-01-13 DIAGNOSIS — E669 Obesity, unspecified: Secondary | ICD-10-CM | POA: Insufficient documentation

## 2019-01-13 DIAGNOSIS — R110 Nausea: Secondary | ICD-10-CM | POA: Insufficient documentation

## 2019-01-13 DIAGNOSIS — Z20828 Contact with and (suspected) exposure to other viral communicable diseases: Secondary | ICD-10-CM | POA: Diagnosis not present

## 2019-01-13 DIAGNOSIS — Z793 Long term (current) use of hormonal contraceptives: Secondary | ICD-10-CM | POA: Insufficient documentation

## 2019-01-13 DIAGNOSIS — I10 Essential (primary) hypertension: Secondary | ICD-10-CM | POA: Diagnosis not present

## 2019-01-13 MED ORDER — MECLIZINE HCL 12.5 MG PO TABS: 12.5000 mg | ORAL_TABLET | Freq: Three times a day (TID) | ORAL | 0 refills | Status: DC | PRN

## 2019-01-13 NOTE — ED Provider Notes (Signed)
[facility]    CSN: [id number] Arrival date & time: 01/13/19  1813      History   Chief Complaint Chief Complaint  Patient presents with  . Dizziness    HPI Patricia Mayo is a 43 y.o. female.   Patient awoke this morning with dizziness.  There is a spinning sensation with nausea.  She denies any ear symptoms. There is no history of elevated blood pressure but here tonight it is elevated with diastolics of 95 and 97. There is also a change in her glasses prescription that she has yet to feel and she thinks that might be a contributing factor HPI  Past Medical History:  Diagnosis Date  . Acne    she has not been taking med for this (including Accutane)   . Obesity     Patient Active Problem List   Diagnosis Date Noted  . Neutropenia ([place])   . Leukopenia     Past Surgical History:  Procedure Laterality Date  . CESAREAN SECTION    . index finger      OB History   No obstetric history on file.      Home Medications    Prior to Admission medications   Medication Sig Start Date End Date Taking? Authorizing Provider  PRESCRIPTION MEDICATION Take 1 tablet by mouth daily. For birth control.   Yes [provider]  ibuprofen (ADVIL,MOTRIN) 200 MG tablet Take 400 mg by mouth daily as needed for moderate pain.     [provider]    Family History No family history on file.  Social History Social History   Tobacco Use  . Smoking status: Never Smoker  . Smokeless tobacco: Never Used  Substance Use Topics  . Alcohol use: No  . Drug use: No     Allergies   Patient has no known allergies.   Review of Systems Review of Systems  Neurological: Positive for dizziness, light-headedness and headaches.  All other systems reviewed and are negative.    Physical Exam Triage Vital Signs ED Triage Vitals  Enc Vitals Group     BP 01/13/19 1851 (!) 145/97     Pulse Rate 01/13/19 1851 84     Resp 01/13/19 1851 16     Temp 01/13/19  1851 98.6 F (37 C)     Temp Source 01/13/19 1851 Oral     SpO2 01/13/19 1851 100 %     Weight --      Height --      Head Circumference --      Peak Flow --      Pain Score 01/13/19 1849 6     Pain Loc --      Pain Edu? --      Excl. in [place]? --    No data found.  Updated Vital Signs BP (!) 151/95   Pulse 84   Temp 98.6 F (37 C) (Oral)   Resp 16   LMP 01/07/2019   SpO2 100%   Visual Acuity Right Eye Distance:   Left Eye Distance:   Bilateral Distance:    Right Eye Near:   Left Eye Near:    Bilateral Near:     Physical Exam Vitals and nursing note reviewed.  Constitutional:      Appearance: Normal appearance. She is obese.  HENT:     Head: Normocephalic.     Right Ear: Tympanic membrane normal.     Left Ear: Tympanic membrane normal.  Eyes:  Extraocular Movements: Extraocular movements intact.     Pupils: Pupils are equal, round, and reactive to light.  Neurological:     General: No focal deficit present.     Mental Status: She is alert and oriented to person, place, and time.     Cranial Nerves: No cranial nerve deficit.     Motor: No weakness.     Coordination: Coordination normal.     Gait: Gait normal.      UC Treatments / Results  Labs (all labs ordered are listed, but only abnormal results are displayed) Labs Reviewed - No data to display  EKG   Radiology No results found.  Procedures Procedures (including critical care time)  Medications Ordered in UC Medications - No data to display  Initial Impression / Assessment and Plan / UC Course  I have reviewed the triage vital signs and the nursing notes.  Pertinent labs & imaging results that were available during my care of the patient were reviewed by me and considered in my medical decision making (see chart for details).     Dizziness, probably multifactorial.  Blood pressure elevation may have a role as well as inner ear infection.  Will give her Epley maneuver and Rx for  meclizine.  Instructions to follow-up blood pressure Final Clinical Impressions(s) / UC Diagnoses   Final diagnoses:  None   Discharge Instructions   None    ED Prescriptions    None     PDMP not reviewed this encounter.   Wardell Honour, MD 01/13/19 [phone]

## 2019-01-13 NOTE — Discharge Instructions (Addendum)
Follow up blood pressure.

## 2019-01-13 NOTE — ED Triage Notes (Signed)
PT woke up with a spinning sensation dizziness and nausea. She was able to work for a few hours. Dizziness is worse with certain movements. Headache for a few days that she attributes to menstrual cycle.

## 2019-01-14 MED FILL — NORLYDA 0.35 MG TABS: 0.35 | 28 days supply | Qty: 28 | Fill #0

## 2019-01-15 LAB — NOVEL CORONAVIRUS, NAA (HOSP ORDER, SEND-OUT TO REF LAB; TAT 18-24 HRS): SARS-CoV-2, NAA: NOT DETECTED

## 2019-01-27 DIAGNOSIS — Z1151 Encounter for screening for human papillomavirus (HPV): Secondary | ICD-10-CM | POA: Diagnosis not present

## 2019-01-27 DIAGNOSIS — Z6832 Body mass index (BMI) 32.0-32.9, adult: Secondary | ICD-10-CM | POA: Diagnosis not present

## 2019-01-27 DIAGNOSIS — Z01419 Encounter for gynecological examination (general) (routine) without abnormal findings: Secondary | ICD-10-CM | POA: Diagnosis not present

## 2019-01-27 DIAGNOSIS — Z1231 Encounter for screening mammogram for malignant neoplasm of breast: Secondary | ICD-10-CM | POA: Diagnosis not present

## 2019-02-05 MED FILL — NORLYDA 0.35 MG TABS: 0.35 | 28 days supply | Qty: 28 | Fill #0

## 2019-03-06 MED FILL — NORLYDA 0.35 MG TABS: 0.35 | 28 days supply | Qty: 28 | Fill #1

## 2019-04-03 MED FILL — NORLYDA 0.35 MG TABS: 0.35 | 84 days supply | Qty: 84 | Fill #2

## 2019-04-30 DIAGNOSIS — M79606 Pain in leg, unspecified: Secondary | ICD-10-CM | POA: Diagnosis not present

## 2019-04-30 DIAGNOSIS — I781 Nevus, non-neoplastic: Secondary | ICD-10-CM | POA: Diagnosis not present

## 2019-04-30 DIAGNOSIS — R7989 Other specified abnormal findings of blood chemistry: Secondary | ICD-10-CM | POA: Diagnosis not present

## 2019-07-01 DIAGNOSIS — Z1159 Encounter for screening for other viral diseases: Secondary | ICD-10-CM | POA: Diagnosis not present

## 2019-07-01 DIAGNOSIS — Z114 Encounter for screening for human immunodeficiency virus [HIV]: Secondary | ICD-10-CM | POA: Diagnosis not present

## 2019-07-01 DIAGNOSIS — N898 Other specified noninflammatory disorders of vagina: Secondary | ICD-10-CM | POA: Diagnosis not present

## 2019-07-01 DIAGNOSIS — B373 Candidiasis of vulva and vagina: Secondary | ICD-10-CM | POA: Diagnosis not present

## 2019-07-01 DIAGNOSIS — Z118 Encounter for screening for other infectious and parasitic diseases: Secondary | ICD-10-CM | POA: Diagnosis not present

## 2019-07-01 DIAGNOSIS — Z113 Encounter for screening for infections with a predominantly sexual mode of transmission: Secondary | ICD-10-CM | POA: Diagnosis not present

## 2019-07-01 MED FILL — FLUCONAZOLE 150 MG TABS: 150 | 7 days supply | Qty: 3 | Fill #0

## 2019-09-16 MED FILL — NORLYDA 0.35 MG TABS: 0.35 | 84 days supply | Qty: 84 | Fill #4

## 2019-10-01 MED FILL — FLUCONAZOLE 150 MG TABS: 150 | 7 days supply | Qty: 3 | Fill #0

## 2019-10-20 DIAGNOSIS — R82998 Other abnormal findings in urine: Secondary | ICD-10-CM | POA: Diagnosis not present

## 2019-10-20 DIAGNOSIS — Z0000 Encounter for general adult medical examination without abnormal findings: Secondary | ICD-10-CM | POA: Diagnosis not present

## 2019-10-20 DIAGNOSIS — E663 Overweight: Secondary | ICD-10-CM | POA: Diagnosis not present

## 2019-10-22 DIAGNOSIS — Z0000 Encounter for general adult medical examination without abnormal findings: Secondary | ICD-10-CM | POA: Diagnosis not present

## 2019-10-22 DIAGNOSIS — M542 Cervicalgia: Secondary | ICD-10-CM | POA: Diagnosis not present

## 2019-10-22 DIAGNOSIS — E663 Overweight: Secondary | ICD-10-CM | POA: Diagnosis not present

## 2019-10-22 DIAGNOSIS — Z23 Encounter for immunization: Secondary | ICD-10-CM | POA: Diagnosis not present

## 2019-10-22 DIAGNOSIS — D72819 Decreased white blood cell count, unspecified: Secondary | ICD-10-CM | POA: Diagnosis not present

## 2019-10-22 DIAGNOSIS — R519 Headache, unspecified: Secondary | ICD-10-CM | POA: Diagnosis not present

## 2019-10-22 DIAGNOSIS — F418 Other specified anxiety disorders: Secondary | ICD-10-CM | POA: Diagnosis not present

## 2019-11-11 ENCOUNTER — Other Ambulatory Visit (HOSPITAL_COMMUNITY): Payer: Self-pay | Admitting: Obstetrics and Gynecology

## 2019-11-11 DIAGNOSIS — B373 Candidiasis of vulva and vagina: Secondary | ICD-10-CM | POA: Diagnosis not present

## 2019-11-11 MED FILL — NYSTATIN-TRIAMCINOLONE CRM: 100000-0.1 | 14 days supply | Qty: 30 | Fill #0

## 2019-12-07 ENCOUNTER — Other Ambulatory Visit (HOSPITAL_COMMUNITY): Payer: Self-pay | Admitting: Obstetrics and Gynecology

## 2019-12-07 MED FILL — NORLYDA 0.35 MG TABS: 0.35 | 84 days supply | Qty: 84 | Fill #0

## 2020-02-27 ENCOUNTER — Other Ambulatory Visit (HOSPITAL_COMMUNITY): Payer: Self-pay | Admitting: Obstetrics and Gynecology

## 2020-02-29 MED FILL — NORLYDA 0.35 MG TABS: 0.35 | 84 days supply | Qty: 84 | Fill #0

## 2020-03-02 ENCOUNTER — Other Ambulatory Visit (HOSPITAL_COMMUNITY): Payer: Self-pay

## 2020-03-02 DIAGNOSIS — Z309 Encounter for contraceptive management, unspecified: Secondary | ICD-10-CM | POA: Diagnosis not present

## 2020-03-02 DIAGNOSIS — Z1159 Encounter for screening for other viral diseases: Secondary | ICD-10-CM | POA: Diagnosis not present

## 2020-03-02 DIAGNOSIS — Z01419 Encounter for gynecological examination (general) (routine) without abnormal findings: Secondary | ICD-10-CM | POA: Diagnosis not present

## 2020-03-02 DIAGNOSIS — Z113 Encounter for screening for infections with a predominantly sexual mode of transmission: Secondary | ICD-10-CM | POA: Diagnosis not present

## 2020-03-02 DIAGNOSIS — Z114 Encounter for screening for human immunodeficiency virus [HIV]: Secondary | ICD-10-CM | POA: Diagnosis not present

## 2020-03-02 MED FILL — PREVIDENT 0.2% RINSE: 0.2 | 30 days supply | Qty: 473 | Fill #0

## 2020-03-16 MED FILL — PREVIDENT 0.2% RINSE: 0.2 | 30 days supply | Qty: 473 | Fill #0

## 2020-05-09 DIAGNOSIS — R350 Frequency of micturition: Secondary | ICD-10-CM | POA: Diagnosis not present

## 2020-05-09 DIAGNOSIS — Z113 Encounter for screening for infections with a predominantly sexual mode of transmission: Secondary | ICD-10-CM | POA: Diagnosis not present

## 2020-05-09 DIAGNOSIS — N898 Other specified noninflammatory disorders of vagina: Secondary | ICD-10-CM | POA: Diagnosis not present

## 2020-05-13 ENCOUNTER — Other Ambulatory Visit (HOSPITAL_COMMUNITY): Payer: Self-pay

## 2020-05-13 MED ORDER — METRONIDAZOLE 500 MG PO TABS
500.0000 mg | ORAL_TABLET | Freq: Two times a day (BID) | ORAL | 0 refills | Status: DC
  Filled 2020-05-13: qty 14, 7d supply, fill #0

## 2020-05-24 ENCOUNTER — Other Ambulatory Visit (HOSPITAL_COMMUNITY): Payer: Self-pay

## 2020-05-24 MED FILL — Norethindrone Tab 0.35 MG: ORAL | 84 days supply | Qty: 84 | Fill #0 | Status: AC

## 2020-06-29 ENCOUNTER — Other Ambulatory Visit (HOSPITAL_COMMUNITY): Payer: Self-pay

## 2020-06-29 MED ORDER — MEGESTROL ACETATE 40 MG PO TABS
ORAL_TABLET | ORAL | 0 refills | Status: DC
  Filled 2020-06-29: qty 30, 30d supply, fill #0

## 2020-06-30 ENCOUNTER — Other Ambulatory Visit (HOSPITAL_COMMUNITY): Payer: Self-pay

## 2020-08-02 ENCOUNTER — Other Ambulatory Visit (HOSPITAL_COMMUNITY): Payer: Self-pay

## 2020-08-02 DIAGNOSIS — N898 Other specified noninflammatory disorders of vagina: Secondary | ICD-10-CM | POA: Diagnosis not present

## 2020-08-02 DIAGNOSIS — B372 Candidiasis of skin and nail: Secondary | ICD-10-CM | POA: Diagnosis not present

## 2020-08-02 MED ORDER — NYSTATIN-TRIAMCINOLONE 100000-0.1 UNIT/GM-% EX CREA
1.0000 "application " | TOPICAL_CREAM | Freq: Two times a day (BID) | CUTANEOUS | 0 refills | Status: DC
  Filled 2020-08-02: qty 30, 15d supply, fill #0

## 2020-08-02 MED ORDER — FLUCONAZOLE 150 MG PO TABS
150.0000 mg | ORAL_TABLET | ORAL | 1 refills | Status: DC
  Filled 2020-08-02: qty 1, 1d supply, fill #0

## 2020-08-15 ENCOUNTER — Other Ambulatory Visit (HOSPITAL_COMMUNITY): Payer: Self-pay

## 2020-08-15 MED FILL — Norethindrone Tab 0.35 MG: ORAL | 84 days supply | Qty: 84 | Fill #1 | Status: AC

## 2020-09-11 ENCOUNTER — Ambulatory Visit (HOSPITAL_COMMUNITY)
Admission: EM | Admit: 2020-09-11 | Discharge: 2020-09-11 | Disposition: A | Discharge status: Home / Self Care | Payer: 59 | Attending: Family Medicine | Admitting: Family Medicine

## 2020-09-11 ENCOUNTER — Other Ambulatory Visit: Payer: Self-pay

## 2020-09-11 ENCOUNTER — Encounter (HOSPITAL_COMMUNITY): Payer: Self-pay | Admitting: *Deleted

## 2020-09-11 DIAGNOSIS — H1031 Unspecified acute conjunctivitis, right eye: Secondary | ICD-10-CM

## 2020-09-11 MED ORDER — ERYTHROMYCIN 5 MG/GM OP OINT: TOPICAL_OINTMENT | OPHTHALMIC | 0 refills | Status: DC

## 2020-09-11 NOTE — ED Provider Notes (Signed)
[facility]    CSN: [id number] Arrival date & time: 09/11/20  1005      History   Chief Complaint Chief Complaint  Patient presents with   Eye Problem    Rt    HPI Patricia Mayo is a 45 y.o. female.   Patient presenting today with 1 day history of right eye and eyelid swelling, thick drainage, itching, irritation.  She denies known injury to the eye, sick contacts, chronic eye issues, headache, nausea, vomiting, dizziness.  She does have falls lashes placed but states she had these multiple times in the past with no issue.  So far using over-the-counter eyedrops with no relief.  Denies any visual changes.   Past Medical History:  Diagnosis Date   Acne    she has not been taking med for this (including Accutane)    Obesity     Patient Active Problem List   Diagnosis Date Noted   Neutropenia ([place])    Leukopenia     Past Surgical History:  Procedure Laterality Date   CESAREAN SECTION     index finger      OB History   No obstetric history on file.      Home Medications    Prior to Admission medications   Medication Sig Start Date End Date Taking? Authorizing Provider  erythromycin ophthalmic ointment Place a 1/2 inch ribbon of ointment into the right lower eyelid BID prn. 09/11/20  Yes Volney American, PA-C  fluconazole (DIFLUCAN) 150 MG tablet Take 1 tablet (150 mg total) by mouth as directed 08/02/20     ibuprofen (ADVIL,MOTRIN) 200 MG tablet Take 400 mg by mouth daily as needed for moderate pain.     [provider]  meclizine (ANTIVERT) 12.5 MG tablet Take 1 tablet (12.5 mg total) by mouth 3 (three) times daily as needed for dizziness. 01/13/19   Wardell Honour, MD  megestrol (MEGACE) 40 MG tablet Take 1 Tablet by mouth every day until  bleeding stops 06/29/20     metroNIDAZOLE (FLAGYL) 500 MG tablet Take 1 Tablet by mouth two times a day 05/13/20     norethindrone (MICRONOR) 0.35 MG tablet TAKE 1 TABLET BY MOUTH EVERY DAY 02/27/20  02/26/21  Servando [place]ina, MD  norethindrone (MICRONOR) 0.35 MG tablet TAKE 1 TABLET BY MOUTH ONCE DAILY. 12/07/19 12/06/20  Brien Few, MD  nystatin-triamcinolone (MYCOLOG II) cream APPLY 1 APPLICATION TOPICALLY TWO TIMES A DAY FOR 10 DAYS 11/11/19 11/10/20  Servando Salina, MD  nystatin-triamcinolone (MYCOLOG II) cream Apply 1 application topically 2 (two) times daily. 08/02/20     PRESCRIPTION MEDICATION Take 1 tablet by mouth daily. For birth control.    [provider]  SODIUM FLUORIDE, DENTAL RINSE, 0.2 % SOLN USE AS AN ORAL RINSE, AS DIRECTED ON PACKAGE 03/02/20 03/02/21      Family History History reviewed. No pertinent family history.  Social History Social History   Tobacco Use   Smoking status: Never   Smokeless tobacco: Never  Substance Use Topics   Alcohol use: No   Drug use: No     Allergies   Patient has no known allergies.   Review of Systems Review of Systems Per HPI  Physical Exam Triage Vital Signs ED Triage Vitals  Enc Vitals Group     BP 09/11/20 1034 138/85     Pulse Rate 09/11/20 1034 99     Resp 09/11/20 1034 16     Temp 09/11/20 1034 98.7 F (37.1 C)  Temp src --      SpO2 09/11/20 1034 100 %     Weight --      Height --      Head Circumference --      Peak Flow --      Pain Score 09/11/20 1031 2     Pain Loc --      Pain Edu? --      Excl. in [place]? --    No data found.  Updated Vital Signs BP 138/85   Pulse 99   Temp 98.7 F (37.1 C)   Resp 16   LMP 08/28/2020   SpO2 100%   Visual Acuity Right Eye Distance:   Left Eye Distance:   Bilateral Distance:    Right Eye Near:   Left Eye Near:    Bilateral Near:     Physical Exam Vitals and nursing note reviewed.  Constitutional:      Appearance: Normal appearance. She is not ill-appearing.  HENT:     Head: Atraumatic.     Mouth/Throat:     Mouth: Mucous membranes are moist.     Pharynx: Oropharynx is clear.  Eyes:     Extraocular Movements:  Extraocular movements intact.     Pupils: Pupils are equal, round, and reactive to light.     Comments: Right conjunctiva diffusely erythematous, injected with thick drainage present.  Right upper and lower eyelids mildly edematous and erythematous  Cardiovascular:     Rate and Rhythm: Normal rate and regular rhythm.     Heart sounds: Normal heart sounds.  Pulmonary:     Effort: Pulmonary effort is normal.     Breath sounds: Normal breath sounds.  Musculoskeletal:        General: Normal range of motion.     Cervical back: Normal range of motion and neck supple.  Skin:    General: Skin is warm and dry.  Neurological:     Mental Status: She is alert and oriented to person, place, and time.  Psychiatric:        Mood and Affect: Mood normal.        Thought Content: Thought content normal.        Judgment: Judgment normal.     UC Treatments / Results  Labs (all labs ordered are listed, but only abnormal results are displayed) Labs Reviewed - No data to display  EKG  Radiology No results found.  Procedures Procedures (including critical care time)  Medications Ordered in UC Medications - No data to display  Initial Impression / Assessment and Plan / UC Course  I have reviewed the triage vital signs and the nursing notes.  Pertinent labs & imaging results that were available during my care of the patient were reviewed by me and considered in my medical decision making (see chart for details).     Consistent with bacterial conjunctivitis.  Will treat with erythromycin ointment, warm compresses, over-the-counter pain relievers as needed.  Good hand hygiene reviewed.  Strict return precautions given for worsening symptoms.  Declines visual acuity as there are no reported visual changes.  Final Clinical Impressions(s) / UC Diagnoses   Final diagnoses:  Acute bacterial conjunctivitis of right eye   Discharge Instructions   None    ED Prescriptions     Medication Sig  Dispense Auth. Provider   erythromycin ophthalmic ointment Place a 1/2 inch ribbon of ointment into the right lower eyelid BID prn. 3.5 g Volney American, [place]  PDMP not reviewed this encounter.   Volney American, [place] 09/11/20 1234

## 2020-09-11 NOTE — ED Triage Notes (Signed)
Pt reports eye problem started yesterday.

## 2020-09-13 ENCOUNTER — Other Ambulatory Visit (HOSPITAL_COMMUNITY): Payer: Self-pay

## 2020-09-13 DIAGNOSIS — J069 Acute upper respiratory infection, unspecified: Secondary | ICD-10-CM | POA: Diagnosis not present

## 2020-09-13 DIAGNOSIS — Z1152 Encounter for screening for COVID-19: Secondary | ICD-10-CM | POA: Diagnosis not present

## 2020-09-13 DIAGNOSIS — J029 Acute pharyngitis, unspecified: Secondary | ICD-10-CM | POA: Diagnosis not present

## 2020-09-13 DIAGNOSIS — R0981 Nasal congestion: Secondary | ICD-10-CM | POA: Diagnosis not present

## 2020-09-13 DIAGNOSIS — R051 Acute cough: Secondary | ICD-10-CM | POA: Diagnosis not present

## 2020-09-13 MED ORDER — AMOXICILLIN-POT CLAVULANATE 875-125 MG PO TABS
1.0000 | ORAL_TABLET | Freq: Two times a day (BID) | ORAL | 0 refills | Status: DC
  Filled 2020-09-13: qty 14, 7d supply, fill #0

## 2020-10-05 ENCOUNTER — Other Ambulatory Visit (HOSPITAL_COMMUNITY): Payer: Self-pay

## 2020-10-05 MED FILL — Norethindrone Tab 0.35 MG: ORAL | 84 days supply | Qty: 84 | Fill #2 | Status: CN

## 2020-10-25 DIAGNOSIS — E663 Overweight: Secondary | ICD-10-CM | POA: Diagnosis not present

## 2020-10-25 DIAGNOSIS — Z0000 Encounter for general adult medical examination without abnormal findings: Secondary | ICD-10-CM | POA: Diagnosis not present

## 2020-11-01 DIAGNOSIS — I781 Nevus, non-neoplastic: Secondary | ICD-10-CM | POA: Diagnosis not present

## 2020-11-01 DIAGNOSIS — L709 Acne, unspecified: Secondary | ICD-10-CM | POA: Diagnosis not present

## 2020-11-01 DIAGNOSIS — M542 Cervicalgia: Secondary | ICD-10-CM | POA: Diagnosis not present

## 2020-11-01 DIAGNOSIS — Z23 Encounter for immunization: Secondary | ICD-10-CM | POA: Diagnosis not present

## 2020-11-01 DIAGNOSIS — D72819 Decreased white blood cell count, unspecified: Secondary | ICD-10-CM | POA: Diagnosis not present

## 2020-11-01 DIAGNOSIS — Z1331 Encounter for screening for depression: Secondary | ICD-10-CM | POA: Diagnosis not present

## 2020-11-01 DIAGNOSIS — Z1389 Encounter for screening for other disorder: Secondary | ICD-10-CM | POA: Diagnosis not present

## 2020-11-01 DIAGNOSIS — F418 Other specified anxiety disorders: Secondary | ICD-10-CM | POA: Diagnosis not present

## 2020-11-01 DIAGNOSIS — Z0000 Encounter for general adult medical examination without abnormal findings: Secondary | ICD-10-CM | POA: Diagnosis not present

## 2020-11-01 DIAGNOSIS — E663 Overweight: Secondary | ICD-10-CM | POA: Diagnosis not present

## 2020-11-07 ENCOUNTER — Other Ambulatory Visit (HOSPITAL_COMMUNITY): Payer: Self-pay

## 2020-11-07 MED FILL — Norethindrone Tab 0.35 MG: ORAL | 84 days supply | Qty: 84 | Fill #2 | Status: AC

## 2020-11-30 DIAGNOSIS — E65 Localized adiposity: Secondary | ICD-10-CM | POA: Diagnosis not present

## 2020-11-30 DIAGNOSIS — N62 Hypertrophy of breast: Secondary | ICD-10-CM | POA: Diagnosis not present

## 2021-01-02 ENCOUNTER — Other Ambulatory Visit (HOSPITAL_COMMUNITY): Payer: Self-pay

## 2021-01-03 ENCOUNTER — Other Ambulatory Visit (HOSPITAL_COMMUNITY): Payer: Self-pay

## 2021-01-03 MED ORDER — NORETHINDRONE 0.35 MG PO TABS
1.0000 | ORAL_TABLET | Freq: Every day | ORAL | 0 refills | Status: DC
  Filled 2021-01-03 – 2021-01-31 (×3): qty 84, 84d supply, fill #0

## 2021-01-31 ENCOUNTER — Other Ambulatory Visit (HOSPITAL_COMMUNITY): Payer: Self-pay

## 2021-03-04 DIAGNOSIS — Z1231 Encounter for screening mammogram for malignant neoplasm of breast: Secondary | ICD-10-CM | POA: Diagnosis not present

## 2021-03-23 ENCOUNTER — Other Ambulatory Visit (HOSPITAL_COMMUNITY): Payer: Self-pay

## 2021-03-24 ENCOUNTER — Other Ambulatory Visit (HOSPITAL_COMMUNITY): Payer: Self-pay

## 2021-03-27 ENCOUNTER — Other Ambulatory Visit (HOSPITAL_COMMUNITY): Payer: Self-pay

## 2021-03-28 ENCOUNTER — Other Ambulatory Visit (HOSPITAL_COMMUNITY): Payer: Self-pay

## 2021-03-29 ENCOUNTER — Other Ambulatory Visit (HOSPITAL_COMMUNITY): Payer: Self-pay

## 2021-03-29 MED ORDER — NORETHINDRONE 0.35 MG PO TABS
1.0000 | ORAL_TABLET | Freq: Every day | ORAL | 0 refills | Status: DC
  Filled 2021-03-29 (×2): qty 28, 28d supply, fill #0

## 2021-04-17 DIAGNOSIS — Z01419 Encounter for gynecological examination (general) (routine) without abnormal findings: Secondary | ICD-10-CM | POA: Diagnosis not present

## 2021-04-17 DIAGNOSIS — Z1159 Encounter for screening for other viral diseases: Secondary | ICD-10-CM | POA: Diagnosis not present

## 2021-04-17 DIAGNOSIS — B3731 Acute candidiasis of vulva and vagina: Secondary | ICD-10-CM | POA: Diagnosis not present

## 2021-04-17 DIAGNOSIS — Z309 Encounter for contraceptive management, unspecified: Secondary | ICD-10-CM | POA: Diagnosis not present

## 2021-04-17 DIAGNOSIS — Z113 Encounter for screening for infections with a predominantly sexual mode of transmission: Secondary | ICD-10-CM | POA: Diagnosis not present

## 2021-04-17 DIAGNOSIS — D252 Subserosal leiomyoma of uterus: Secondary | ICD-10-CM | POA: Diagnosis not present

## 2021-04-17 DIAGNOSIS — Z114 Encounter for screening for human immunodeficiency virus [HIV]: Secondary | ICD-10-CM | POA: Diagnosis not present

## 2021-04-18 ENCOUNTER — Other Ambulatory Visit (HOSPITAL_COMMUNITY): Payer: Self-pay

## 2021-04-18 DIAGNOSIS — D252 Subserosal leiomyoma of uterus: Secondary | ICD-10-CM | POA: Diagnosis not present

## 2021-04-18 DIAGNOSIS — Z113 Encounter for screening for infections with a predominantly sexual mode of transmission: Secondary | ICD-10-CM | POA: Diagnosis not present

## 2021-04-18 DIAGNOSIS — Z01419 Encounter for gynecological examination (general) (routine) without abnormal findings: Secondary | ICD-10-CM | POA: Diagnosis not present

## 2021-04-18 DIAGNOSIS — Z114 Encounter for screening for human immunodeficiency virus [HIV]: Secondary | ICD-10-CM | POA: Diagnosis not present

## 2021-04-18 DIAGNOSIS — Z1159 Encounter for screening for other viral diseases: Secondary | ICD-10-CM | POA: Diagnosis not present

## 2021-04-18 DIAGNOSIS — Z309 Encounter for contraceptive management, unspecified: Secondary | ICD-10-CM | POA: Diagnosis not present

## 2021-04-18 DIAGNOSIS — B3731 Acute candidiasis of vulva and vagina: Secondary | ICD-10-CM | POA: Diagnosis not present

## 2021-04-18 MED ORDER — NORETHINDRONE 0.35 MG PO TABS
1.0000 | ORAL_TABLET | Freq: Every day | ORAL | 4 refills | Status: DC
  Filled 2021-04-18 – 2021-05-01 (×2): qty 84, 84d supply, fill #0

## 2021-04-26 ENCOUNTER — Other Ambulatory Visit (HOSPITAL_COMMUNITY): Payer: Self-pay

## 2021-05-01 ENCOUNTER — Other Ambulatory Visit (HOSPITAL_COMMUNITY): Payer: Self-pay

## 2021-06-19 ENCOUNTER — Other Ambulatory Visit (HOSPITAL_COMMUNITY): Payer: Self-pay

## 2021-06-19 ENCOUNTER — Ambulatory Visit: Payer: Self-pay | Admitting: Plastic Surgery

## 2021-06-19 DIAGNOSIS — N62 Hypertrophy of breast: Secondary | ICD-10-CM | POA: Diagnosis not present

## 2021-06-19 MED ORDER — OXYCODONE-ACETAMINOPHEN 5-325 MG PO TABS
1.0000 | ORAL_TABLET | ORAL | 0 refills | Status: AC | PRN
  Filled 2021-06-19 – 2021-06-28 (×2): qty 15, 3d supply, fill #0

## 2021-06-19 MED ORDER — CELECOXIB 100 MG PO CAPS
100.0000 mg | ORAL_CAPSULE | Freq: Two times a day (BID) | ORAL | 0 refills | Status: DC
  Filled 2021-06-19 – 2021-06-28 (×2): qty 20, 10d supply, fill #0

## 2021-06-23 ENCOUNTER — Other Ambulatory Visit: Payer: Self-pay

## 2021-06-23 ENCOUNTER — Encounter (HOSPITAL_BASED_OUTPATIENT_CLINIC_OR_DEPARTMENT_OTHER): Payer: Self-pay | Admitting: Plastic Surgery

## 2021-06-27 ENCOUNTER — Other Ambulatory Visit (HOSPITAL_COMMUNITY): Payer: Self-pay

## 2021-06-28 ENCOUNTER — Other Ambulatory Visit (HOSPITAL_COMMUNITY): Payer: Self-pay

## 2021-06-29 ENCOUNTER — Other Ambulatory Visit (HOSPITAL_COMMUNITY): Payer: Self-pay

## 2021-06-30 NOTE — Progress Notes (Signed)
Patient given pre-surgical soap. Education provided and patient verbalized understanding.

## 2021-07-03 ENCOUNTER — Ambulatory Visit (HOSPITAL_BASED_OUTPATIENT_CLINIC_OR_DEPARTMENT_OTHER)
Admission: RE | Admit: 2021-07-03 | Discharge: 2021-07-03 | Disposition: A | Discharge status: Home / Self Care | Payer: 59 | Attending: Plastic Surgery | Admitting: Plastic Surgery

## 2021-07-03 ENCOUNTER — Other Ambulatory Visit (HOSPITAL_COMMUNITY): Payer: Self-pay

## 2021-07-03 ENCOUNTER — Other Ambulatory Visit: Payer: Self-pay

## 2021-07-03 ENCOUNTER — Ambulatory Visit (HOSPITAL_BASED_OUTPATIENT_CLINIC_OR_DEPARTMENT_OTHER): Payer: 59 | Admitting: Anesthesiology

## 2021-07-03 ENCOUNTER — Encounter (HOSPITAL_BASED_OUTPATIENT_CLINIC_OR_DEPARTMENT_OTHER): Payer: Self-pay | Admitting: Plastic Surgery

## 2021-07-03 ENCOUNTER — Encounter (HOSPITAL_BASED_OUTPATIENT_CLINIC_OR_DEPARTMENT_OTHER)
Admission: RE | Disposition: A | Discharge status: Home / Self Care | Payer: Self-pay | Source: Home / Self Care | Attending: Plastic Surgery

## 2021-07-03 DIAGNOSIS — M546 Pain in thoracic spine: Secondary | ICD-10-CM | POA: Diagnosis not present

## 2021-07-03 DIAGNOSIS — Z6832 Body mass index (BMI) 32.0-32.9, adult: Secondary | ICD-10-CM | POA: Insufficient documentation

## 2021-07-03 DIAGNOSIS — N6012 Diffuse cystic mastopathy of left breast: Secondary | ICD-10-CM | POA: Diagnosis not present

## 2021-07-03 DIAGNOSIS — G8929 Other chronic pain: Secondary | ICD-10-CM | POA: Diagnosis not present

## 2021-07-03 DIAGNOSIS — N6011 Diffuse cystic mastopathy of right breast: Secondary | ICD-10-CM | POA: Diagnosis not present

## 2021-07-03 DIAGNOSIS — N62 Hypertrophy of breast: Secondary | ICD-10-CM | POA: Diagnosis not present

## 2021-07-03 DIAGNOSIS — E669 Obesity, unspecified: Secondary | ICD-10-CM | POA: Diagnosis not present

## 2021-07-03 DIAGNOSIS — M793 Panniculitis, unspecified: Secondary | ICD-10-CM | POA: Insufficient documentation

## 2021-07-03 DIAGNOSIS — M542 Cervicalgia: Secondary | ICD-10-CM | POA: Diagnosis not present

## 2021-07-03 DIAGNOSIS — M25519 Pain in unspecified shoulder: Secondary | ICD-10-CM | POA: Diagnosis not present

## 2021-07-03 DIAGNOSIS — M5489 Other dorsalgia: Secondary | ICD-10-CM | POA: Diagnosis not present

## 2021-07-03 HISTORY — PX: BREAST REDUCTION SURGERY: SHX8

## 2021-07-03 LAB — POCT PREGNANCY, URINE: Preg Test, Ur: NEGATIVE

## 2021-07-03 SURGERY — MAMMOPLASTY, REDUCTION
Anesthesia: General | Site: Breast | Laterality: Bilateral

## 2021-07-03 MED ORDER — PROPOFOL 10 MG/ML IV BOLUS
INTRAVENOUS | Status: AC
Start: 2021-07-03 — End: ?
  Filled 2021-07-03: qty 20

## 2021-07-03 MED ORDER — OXYCODONE HCL 5 MG PO TABS
ORAL_TABLET | ORAL | Status: AC
  Filled 2021-07-03: qty 1

## 2021-07-03 MED ORDER — OXYCODONE HCL 5 MG PO TABS
5.0000 mg | ORAL_TABLET | Freq: Once | ORAL | Status: AC | PRN
  Administered 2021-07-03: 5 mg via ORAL

## 2021-07-03 MED ORDER — FENTANYL CITRATE (PF) 100 MCG/2ML IJ SOLN
25.0000 ug | INTRAMUSCULAR | Status: DC | PRN
  Administered 2021-07-03 (×2): 50 ug via INTRAVENOUS

## 2021-07-03 MED ORDER — FENTANYL CITRATE (PF) 100 MCG/2ML IJ SOLN
INTRAMUSCULAR | Status: DC | PRN
  Administered 2021-07-03: 25 ug via INTRAVENOUS
  Administered 2021-07-03: 50 ug via INTRAVENOUS
  Administered 2021-07-03: 25 ug via INTRAVENOUS
  Administered 2021-07-03 (×2): 50 ug via INTRAVENOUS
  Administered 2021-07-03 (×2): 25 ug via INTRAVENOUS

## 2021-07-03 MED ORDER — ONDANSETRON HCL 4 MG/2ML IJ SOLN
INTRAMUSCULAR | Status: AC
  Filled 2021-07-03: qty 2

## 2021-07-03 MED ORDER — ONDANSETRON HCL 4 MG/2ML IJ SOLN
INTRAMUSCULAR | Status: DC | PRN
  Administered 2021-07-03 (×2): 4 mg via INTRAVENOUS

## 2021-07-03 MED ORDER — OXYCODONE HCL 5 MG/5ML PO SOLN: 5.0000 mg | Freq: Once | ORAL | Status: AC | PRN

## 2021-07-03 MED ORDER — AMISULPRIDE (ANTIEMETIC) 5 MG/2ML IV SOLN: 10.0000 mg | Freq: Once | INTRAVENOUS | Status: DC | PRN

## 2021-07-03 MED ORDER — SCOPOLAMINE 1 MG/3DAYS TD PT72
MEDICATED_PATCH | TRANSDERMAL | Status: AC
Start: 2021-07-03 — End: ?
  Filled 2021-07-03: qty 1

## 2021-07-03 MED ORDER — LACTATED RINGERS IV SOLN: INTRAVENOUS | Status: DC

## 2021-07-03 MED ORDER — DEXAMETHASONE SODIUM PHOSPHATE 10 MG/ML IJ SOLN
INTRAMUSCULAR | Status: DC | PRN
  Administered 2021-07-03: 5 mg via INTRAVENOUS

## 2021-07-03 MED ORDER — FENTANYL CITRATE (PF) 100 MCG/2ML IJ SOLN
INTRAMUSCULAR | Status: AC
  Filled 2021-07-03: qty 2

## 2021-07-03 MED ORDER — 0.9 % SODIUM CHLORIDE (POUR BTL) OPTIME
TOPICAL | Status: DC | PRN
  Administered 2021-07-03: 1000 mL

## 2021-07-03 MED ORDER — DEXAMETHASONE SODIUM PHOSPHATE 10 MG/ML IJ SOLN
INTRAMUSCULAR | Status: AC
Start: 2021-07-03 — End: ?
  Filled 2021-07-03: qty 1

## 2021-07-03 MED ORDER — SUGAMMADEX SODIUM 200 MG/2ML IV SOLN
INTRAVENOUS | Status: DC | PRN
  Administered 2021-07-03: 100 mg via INTRAVENOUS

## 2021-07-03 MED ORDER — ACETAMINOPHEN 500 MG PO TABS
ORAL_TABLET | ORAL | Status: AC
Start: 2021-07-03 — End: ?
  Filled 2021-07-03: qty 1

## 2021-07-03 MED ORDER — SUCCINYLCHOLINE CHLORIDE 200 MG/10ML IV SOSY
PREFILLED_SYRINGE | INTRAVENOUS | Status: DC | PRN
  Administered 2021-07-03: 120 mg via INTRAVENOUS

## 2021-07-03 MED ORDER — BUPIVACAINE-EPINEPHRINE (PF) 0.25% -1:200000 IJ SOLN
INTRAMUSCULAR | Status: AC
  Filled 2021-07-03: qty 30

## 2021-07-03 MED ORDER — ROCURONIUM BROMIDE 10 MG/ML (PF) SYRINGE
PREFILLED_SYRINGE | INTRAVENOUS | Status: AC
  Filled 2021-07-03: qty 10

## 2021-07-03 MED ORDER — ONDANSETRON HCL 4 MG/2ML IJ SOLN
4.0000 mg | Freq: Once | INTRAMUSCULAR | Status: DC | PRN
Start: 2021-07-03 — End: 2021-07-03

## 2021-07-03 MED ORDER — MIDAZOLAM HCL 2 MG/2ML IJ SOLN
INTRAMUSCULAR | Status: AC
  Filled 2021-07-03: qty 2

## 2021-07-03 MED ORDER — BUPIVACAINE-EPINEPHRINE (PF) 0.5% -1:200000 IJ SOLN
INTRAMUSCULAR | Status: AC
  Filled 2021-07-03: qty 30

## 2021-07-03 MED ORDER — HEPARIN SODIUM (PORCINE) 5000 UNIT/ML IJ SOLN
INTRAMUSCULAR | Status: AC
  Filled 2021-07-03: qty 1

## 2021-07-03 MED ORDER — CHLORHEXIDINE GLUCONATE CLOTH 2 % EX PADS: 6.0000 | MEDICATED_PAD | Freq: Once | CUTANEOUS | Status: DC

## 2021-07-03 MED ORDER — MIDAZOLAM HCL 5 MG/5ML IJ SOLN
INTRAMUSCULAR | Status: DC | PRN
  Administered 2021-07-03: 2 mg via INTRAVENOUS

## 2021-07-03 MED ORDER — LIDOCAINE 2% (20 MG/ML) 5 ML SYRINGE
INTRAMUSCULAR | Status: AC
  Filled 2021-07-03: qty 5

## 2021-07-03 MED ORDER — PHENYLEPHRINE HCL (PRESSORS) 10 MG/ML IV SOLN
INTRAVENOUS | Status: DC | PRN
  Administered 2021-07-03 (×3): 80 ug via INTRAVENOUS
  Administered 2021-07-03: 40 ug via INTRAVENOUS

## 2021-07-03 MED ORDER — CEFAZOLIN SODIUM-DEXTROSE 2-4 GM/100ML-% IV SOLN
INTRAVENOUS | Status: AC
  Filled 2021-07-03: qty 100

## 2021-07-03 MED ORDER — CEFAZOLIN SODIUM-DEXTROSE 2-4 GM/100ML-% IV SOLN
2.0000 g | INTRAVENOUS | Status: AC
  Administered 2021-07-03: 2 g via INTRAVENOUS

## 2021-07-03 MED ORDER — PROPOFOL 10 MG/ML IV BOLUS
INTRAVENOUS | Status: DC | PRN
  Administered 2021-07-03: 150 mg via INTRAVENOUS

## 2021-07-03 MED ORDER — DEXMEDETOMIDINE (PRECEDEX) IN NS 20 MCG/5ML (4 MCG/ML) IV SYRINGE
PREFILLED_SYRINGE | INTRAVENOUS | Status: DC | PRN
  Administered 2021-07-03: 8 ug via INTRAVENOUS
  Administered 2021-07-03: 4 ug via INTRAVENOUS

## 2021-07-03 MED ORDER — PROMETHAZINE HCL 12.5 MG PO TABS
12.5000 mg | ORAL_TABLET | Freq: Four times a day (QID) | ORAL | 0 refills | Status: DC | PRN
  Filled 2021-07-03: qty 15, 4d supply, fill #0

## 2021-07-03 MED ORDER — HEPARIN SODIUM (PORCINE) 5000 UNIT/ML IJ SOLN
5000.0000 [IU] | Freq: Once | INTRAMUSCULAR | Status: AC
  Administered 2021-07-03: 5000 [IU] via SUBCUTANEOUS

## 2021-07-03 MED ORDER — ROCURONIUM BROMIDE 100 MG/10ML IV SOLN
INTRAVENOUS | Status: DC | PRN
  Administered 2021-07-03: 15 mg via INTRAVENOUS
  Administered 2021-07-03: 20 mg via INTRAVENOUS

## 2021-07-03 MED ORDER — ACETAMINOPHEN 500 MG PO TABS
1000.0000 mg | ORAL_TABLET | Freq: Once | ORAL | Status: AC
  Administered 2021-07-03: 1000 mg via ORAL

## 2021-07-03 MED ORDER — SCOPOLAMINE 1 MG/3DAYS TD PT72
1.0000 | MEDICATED_PATCH | TRANSDERMAL | Status: DC
  Administered 2021-07-03: 1.5 mg via TRANSDERMAL

## 2021-07-03 MED ORDER — LIDOCAINE HCL (CARDIAC) PF 100 MG/5ML IV SOSY
PREFILLED_SYRINGE | INTRAVENOUS | Status: DC | PRN
  Administered 2021-07-03: 50 mg via INTRAVENOUS

## 2021-07-03 SURGICAL SUPPLY — 56 items
ADH SKN CLS APL DERMABOND .7 (GAUZE/BANDAGES/DRESSINGS) ×2
APL PRP STRL LF DISP 70% ISPRP (MISCELLANEOUS) ×1
BALL CTTN LRG ABS STRL LF (GAUZE/BANDAGES/DRESSINGS)
BINDER BREAST XXLRG (GAUZE/BANDAGES/DRESSINGS) ×1 IMPLANT
BLADE HEX COATED 2.75 (ELECTRODE) ×1 IMPLANT
BLADE SURG 10 STRL SS (BLADE) ×4 IMPLANT
BLADE SURG 15 STRL LF DISP TIS (BLADE) ×1 IMPLANT
BLADE SURG 15 STRL SS (BLADE) ×2
BNDG ELASTIC 6X5.8 VLCR STR LF (GAUZE/BANDAGES/DRESSINGS) ×4 IMPLANT
BNDG EYE OVAL (GAUZE/BANDAGES/DRESSINGS) IMPLANT
CANISTER SUCT 1200ML W/VALVE (MISCELLANEOUS) ×2 IMPLANT
CHLORAPREP W/TINT 26 (MISCELLANEOUS) ×2 IMPLANT
COTTONBALL LRG STERILE PKG (GAUZE/BANDAGES/DRESSINGS) IMPLANT
COVER BACK TABLE 60X90IN (DRAPES) ×2 IMPLANT
COVER MAYO STAND STRL (DRAPES) ×2 IMPLANT
DERMABOND ADVANCED (GAUZE/BANDAGES/DRESSINGS) ×2
DERMABOND ADVANCED .7 DNX12 (GAUZE/BANDAGES/DRESSINGS) ×2 IMPLANT
DRAIN CHANNEL 19F RND (DRAIN) ×2 IMPLANT
DRAPE LAPAROSCOPIC ABDOMINAL (DRAPES) ×2 IMPLANT
DRAPE UTILITY XL STRL (DRAPES) ×2 IMPLANT
DRSG PAD ABDOMINAL 8X10 ST (GAUZE/BANDAGES/DRESSINGS) ×8 IMPLANT
ELECT REM PT RETURN 9FT ADLT (ELECTROSURGICAL) ×2
ELECTRODE REM PT RTRN 9FT ADLT (ELECTROSURGICAL) ×1 IMPLANT
EVACUATOR SILICONE 100CC (DRAIN) ×3 IMPLANT
GAUZE SPONGE 4X4 12PLY STRL (GAUZE/BANDAGES/DRESSINGS) ×4 IMPLANT
GAUZE SPONGE 4X4 12PLY STRL LF (GAUZE/BANDAGES/DRESSINGS) IMPLANT
GLOVE BIO SURGEON STRL SZ 6.5 (GLOVE) ×1 IMPLANT
GLOVE BIO SURGEON STRL SZ7.5 (GLOVE) ×2 IMPLANT
GLOVE BIOGEL PI IND STRL 7.0 (GLOVE) IMPLANT
GLOVE BIOGEL PI IND STRL 8 (GLOVE) ×1 IMPLANT
GLOVE BIOGEL PI INDICATOR 7.0 (GLOVE) ×3
GLOVE BIOGEL PI INDICATOR 8 (GLOVE) ×1
GOWN STRL REUS W/ TWL LRG LVL3 (GOWN DISPOSABLE) ×1 IMPLANT
GOWN STRL REUS W/ TWL XL LVL3 (GOWN DISPOSABLE) ×1 IMPLANT
GOWN STRL REUS W/TWL LRG LVL3 (GOWN DISPOSABLE) ×8
GOWN STRL REUS W/TWL XL LVL3 (GOWN DISPOSABLE) ×2
NS IRRIG 1000ML POUR BTL (IV SOLUTION) ×2 IMPLANT
PACK BASIN DAY SURGERY FS (CUSTOM PROCEDURE TRAY) ×2 IMPLANT
PENCIL SMOKE EVACUATOR (MISCELLANEOUS) ×2 IMPLANT
PIN SAFETY STERILE (MISCELLANEOUS) ×1 IMPLANT
SLEEVE SCD COMPRESS KNEE MED (STOCKING) ×1 IMPLANT
SPONGE T-LAP 18X18 ~~LOC~~+RFID (SPONGE) ×8 IMPLANT
SUT MNCRL AB 3-0 PS2 18 (SUTURE) ×14 IMPLANT
SUT MNCRL AB 4-0 PS2 18 (SUTURE) ×7 IMPLANT
SUT MON AB 2-0 CT1 36 (SUTURE) ×5 IMPLANT
SUT PROLENE 3 0 PS 1 (SUTURE) ×1 IMPLANT
SUT PROLENE 4 0 PS 2 18 (SUTURE) IMPLANT
SUT PROLENE 5 0 PS 2 (SUTURE) IMPLANT
SUT SILK 4 0 PS 2 (SUTURE) IMPLANT
SUT VIC AB 2-0 PS2 27 (SUTURE) IMPLANT
SUT VICRYL 3-0 CR8 SH (SUTURE) IMPLANT
SYR BULB IRRIG 60ML STRL (SYRINGE) ×4 IMPLANT
TOWEL GREEN STERILE FF (TOWEL DISPOSABLE) ×6 IMPLANT
TUBE CONNECTING 20X1/4 (TUBING) ×2 IMPLANT
UNDERPAD 30X36 HEAVY ABSORB (UNDERPADS AND DIAPERS) ×4 IMPLANT
YANKAUER SUCT BULB TIP NO VENT (SUCTIONS) ×2 IMPLANT

## 2021-07-03 NOTE — Discharge Instructions (Addendum)
No lifting for 6 weeks No vigorous activity for 6 weeks (including outdoor walks) No driving for 4 weeks OK to walk up stairs slowly Stay propped up Use incentive spirometer at home every hour while awake No shower while drains are in place Empty drains at least three times a day and record the amounts separately No heat, No ice, no raising arms overhead, no lifting Expect drainage on the dressings Return to office Wednesday For questions call [phone] or after hours call [phone]   Post Anesthesia Home Care Instructions  Activity: Get plenty of rest for the remainder of the day. A responsible individual must stay with you for 24 hours following the procedure.  For the next 24 hours, DO NOT: -Drive a car -Paediatric nurse -Drink alcoholic beverages -Take any medication unless instructed by your physician -Make any legal decisions or sign important papers.  Meals: Start with liquid foods such as gelatin or soup. Progress to regular foods as tolerated. Avoid greasy, spicy, heavy foods. If nausea and/or vomiting occur, drink only clear liquids until the nausea and/or vomiting subsides. Call your physician if vomiting continues.  Special Instructions/Symptoms: Your throat may feel dry or sore from the anesthesia or the breathing tube placed in your throat during surgery. If this causes discomfort, gargle with warm salt water. The discomfort should disappear within 24 hours.  If you had a scopolamine patch placed behind your ear for the management of post- operative nausea and/or vomiting:  1. The medication in the patch is effective for 72 hours, after which it should be removed.  Wrap patch in a tissue and discard in the trash. Wash hands thoroughly with soap and water. 2. You may remove the patch earlier than 72 hours if you experience unpleasant side effects which may include dry mouth, dizziness or visual disturbances. 3. Avoid touching the patch. Wash your hands with soap  and water after contact with the patch.     No tylenol until after 1pm today if needed  About my Jackson-Pratt Bulb Drain  What is a Jackson-Pratt bulb? A Jackson-Pratt is a soft, round device used to collect drainage. It is connected to a long, thin drainage catheter, which is held in place by one or two small stiches near your surgical incision site. When the bulb is squeezed, it forms a vacuum, forcing the drainage to empty into the bulb.  Emptying the Jackson-Pratt bulb- To empty the bulb: 1. Release the plug on the top of the bulb. 2. Pour the bulb's contents into a measuring container which your nurse will provide. 3. Record the time emptied and amount of drainage. Empty the drain(s) as often as your     doctor or nurse recommends.  Date                  Time                    Amount (Drain 1)                 Amount (Drain 2)  _____________________________________________________________________  _____________________________________________________________________  _____________________________________________________________________  _____________________________________________________________________  _____________________________________________________________________  _____________________________________________________________________  _____________________________________________________________________  _____________________________________________________________________  Squeezing the Jackson-Pratt Bulb- To squeeze the bulb: 1. Make sure the plug at the top of the bulb is open. 2. Squeeze the bulb tightly in your fist. You will hear air squeezing from the bulb. 3. Replace the plug while the bulb is squeezed. 4. Use a safety pin to attach the bulb to your clothing.  This will keep the catheter from     pulling at the bulb insertion site.  When to call your doctor- Call your doctor if: Drain site becomes red, swollen or hot. You have a fever greater than  101 degrees F. There is oozing at the drain site. Drain falls out (apply a guaze bandage over the drain hole and secure it with tape). Drainage increases daily not related to activity patterns. (You will usually have more drainage when you are active than when you are resting.) Drainage has a bad odor.      JP Drain [employer] this sheet to all of your post-operative appointments while you have your drains. Please measure your drains by CC's or ML's. Make sure you drain and measure your JP Drains 2 or 3 times per day. At the end of each day, add up totals for the left side and add up totals for the right side.    ( 9 am )     ( 3 pm )        ( 9 pm )                Date L  R  L  R  L  R  Total L/R

## 2021-07-03 NOTE — Transfer of Care (Signed)
Immediate Anesthesia Transfer of Care Note  Patient: Patricia Mayo  Procedure(s) Performed: MAMMARY REDUCTION  (BREAST) (Bilateral: Breast)  Patient Location: PACU  Anesthesia Type:General  Level of Consciousness: oriented, drowsy and patient cooperative  Airway & Oxygen Therapy: Patient Spontanous Breathing and Patient connected to face mask oxygen  Post-op Assessment: Report given to RN and Post -op Vital signs reviewed and stable  Post vital signs: Reviewed and stable  Last Vitals:  Vitals Value Taken Time  BP 116/73 07/03/21 1116  Temp    Pulse 88 07/03/21 1118  Resp 19 07/03/21 1118  SpO2 100 % 07/03/21 1118  Vitals shown include unvalidated device data.  Last Pain:  Vitals:   07/03/21 2111  TempSrc: Oral  PainSc: 0-No pain      Patients Stated Pain Goal: 5 (73/56/70 1410)  Complications: No notable events documented.

## 2021-07-03 NOTE — Brief Op Note (Signed)
07/03/2021  11:16 AM  PATIENT:  Patricia Mayo  46 y.o. female  PRE-OPERATIVE DIAGNOSIS:  macromastia, chronic neck and back pain  POST-OPERATIVE DIAGNOSIS:  macromastia, chronic neck and back pain  PROCEDURE:  Procedure(s): MAMMARY REDUCTION  (BREAST) (Bilateral)  SURGEON:  Surgeon(s) and Role:    Crissie Reese, MD - Primary  PHYSICIAN ASSISTANT:   ASSISTANTS: none   ANESTHESIA:   general  EBL:  150 mL   BLOOD ADMINISTERED:none  DRAINS: (2) Jackson-Pratt drain(s) with closed bulb suction in the left and right breast    LOCAL MEDICATIONS USED:  NONE  SPECIMEN:  Source of Specimen:  Bilateral breast reduction  DISPOSITION OF SPECIMEN:  PATHOLOGY  COUNTS:  YES  TOURNIQUET:  * No tourniquets in log *  DICTATION: .Note written in EPIC  PLAN OF CARE: Discharge to home after PACU  PATIENT DISPOSITION:  PACU - hemodynamically stable.   Delay start of Pharmacological VTE agent (>24hrs) due to surgical blood loss or risk of bleeding: not applicable

## 2021-07-03 NOTE — Anesthesia Procedure Notes (Signed)
Procedure Name: Intubation Date/Time: 07/03/2021 7:46 AM  Performed by: Garrel Ridgel, CRNAPre-anesthesia Checklist: Patient identified, Emergency Drugs available, Suction available and Patient being monitored Patient Re-evaluated:Patient Re-evaluated prior to induction Oxygen Delivery Method: Circle system utilized Preoxygenation: Pre-oxygenation with 100% oxygen Induction Type: IV induction Ventilation: Mask ventilation without difficulty Laryngoscope Size: Mac and 3 Grade View: Grade I Tube type: Oral Tube size: 7.0 mm Number of attempts: 1 Airway Equipment and Method: Stylet and Oral airway Placement Confirmation: ETT inserted through vocal cords under direct vision, positive ETCO2 and breath sounds checked- equal and bilateral Secured at: 23 cm Tube secured with: Tape Dental Injury: Teeth and Oropharynx as per pre-operative assessment

## 2021-07-03 NOTE — Op Note (Signed)
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  She requests a breast reduction.  She would like to remove approximately one half of her present breast volume.  The nature of the procedure and risk were discussed with her in detail.  She voiced her understanding of all of this and all of her questions were answered and she wished to proceed.  Scription procedure: Patient was into the operating room and placed supine.  After successful induction of general anesthesia she was prepped with ChloraPrep and draped with sterile drapes.  An 8 cm wide inferior nipple areolar pedicle was designed bilateral and a 42 mm cookie cutter was used to determine the new nipple areolar diameter.  Incisions were made and inferior nipple areolar pedicles were de-epithelialized.  The medial quadrant was also de-epithelialized and preserved.  The inferior nipple-areolar pedicle was then isolated from the surrounding tissues.  The medial quadrant was left as a portion of the inferior pedicle.  Care was taken to leave a broader attachment at the level of the chest wall then was present at the level of the skin.  At the conclusion of this dissection the nipple areolar complexes had excellent color and bright red bleeding around the periphery consistent with viability.  Resections were performed medial central and lateral.  A total of 808 g were resected from the larger right side and a total of 695 g from the smaller left side.  These resection volumes appear to give her fairly good symmetry.  The wounds were irrigated thoroughly with saline and meticulous hemostasis was  achieved using electrocautery.  A 19 French drain was positioned on each side and brought out through the lateral aspect of the wound and secured with a 3-0 Prolene suture.  The closures were performed with 2-0 Monocryl interrupted inverted deep dermal sutures as well as the Ensor intradermal stapling device followed by 3-0 Monocryl running subcuticular suture.  A measurement was then taken approximately 5 cm up from the inframammary crease and the 42 mm marker marker the new site for the nipple areolar complex.  This tissue was resected and again hemostasis was achieved using electrocautery.  The wound was irrigated with saline.  Nipple complexes were brought through this opening and were again inspected.  They were found to have good color with bright red bleeding around the periphery consistent with viability.  Nipple areolar insetting was performed with 3-0 Monocryl interrupted inverted deep dermal sutures and running 4 Monocryl subcuticular suture.  Dermabond, dry sterile dressings, circumferential Ace wrap, and the breast vest were positioned and she was transferred to the recovery room stable having tolerated the procedure well.  Disposition: She will follow-up in the office in 2 days.

## 2021-07-03 NOTE — Anesthesia Postprocedure Evaluation (Signed)
Anesthesia Post Note  Patient: Patricia Mayo  Procedure(s) Performed: MAMMARY REDUCTION  (BREAST) (Bilateral: Breast)     Patient location during evaluation: PACU Anesthesia Type: General Level of consciousness: awake Pain management: pain level controlled Vital Signs Assessment: post-procedure vital signs reviewed and stable Respiratory status: spontaneous breathing and respiratory function stable Cardiovascular status: stable Postop Assessment: no apparent nausea or vomiting Anesthetic complications: no   No notable events documented.  Last Vitals:  Vitals:   07/03/21 1130 07/03/21 1145  BP: 118/76 120/73  Pulse: 88 89  Resp: 19 19  Temp:    SpO2: 100% 100%    Last Pain:  Vitals:   07/03/21 1145  TempSrc:   PainSc: 4                  Candra R Sartaj Hoskin

## 2021-07-03 NOTE — Anesthesia Preprocedure Evaluation (Addendum)
Anesthesia Evaluation  Patient identified by MRN, date of birth, ID band Patient awake    Reviewed: Allergy & Precautions, NPO status , Patient's Chart, lab work & pertinent test results  Airway Mallampati: II  TM Distance: >3 FB Neck ROM: Full    Dental  (+)    Pulmonary neg pulmonary ROS,    Pulmonary exam normal breath sounds clear to auscultation       Cardiovascular negative cardio ROS Normal cardiovascular exam Rhythm:Regular Rate:Normal     Neuro/Psych negative neurological ROS  negative psych ROS   GI/Hepatic Neg liver ROS, + nausea/diarrhea this AM   Endo/Other  obesity  Renal/GU negative Renal ROS  negative genitourinary   Musculoskeletal negative musculoskeletal ROS (+)   Abdominal   Peds negative pediatric ROS (+)  Hematology negative hematology ROS (+)   Anesthesia Other Findings   Reproductive/Obstetrics negative OB ROS                            Anesthesia Physical Anesthesia Plan  ASA: 2  Anesthesia Plan: General   Post-op Pain Management: Tylenol PO (pre-op)*   Induction: Intravenous, Rapid sequence and Cricoid pressure planned  PONV Risk Score and Plan: 3 and Treatment may vary due to age or medical condition, Midazolam, Scopolamine patch - Pre-op, Ondansetron and Dexamethasone  Airway Management Planned: Oral ETT  Additional Equipment: None  Intra-op Plan:   Post-operative Plan: Extubation in OR  Informed Consent: I have reviewed the patients History and Physical, chart, labs and discussed the procedure including the risks, benefits and alternatives for the proposed anesthesia with the patient or authorized representative who has indicated his/her understanding and acceptance.     Dental advisory given  Plan Discussed with: CRNA, Anesthesiologist and Surgeon  Anesthesia Plan Comments:        Anesthesia Quick Evaluation

## 2021-07-03 NOTE — H&P (Signed)
I have re-examined and re-evaluated the patient and there are no changes.  See office notes in paper chart for H&P. 

## 2021-07-04 ENCOUNTER — Encounter (HOSPITAL_BASED_OUTPATIENT_CLINIC_OR_DEPARTMENT_OTHER): Payer: Self-pay | Admitting: Plastic Surgery

## 2021-07-04 LAB — SURGICAL PATHOLOGY

## 2021-07-12 ENCOUNTER — Other Ambulatory Visit (HOSPITAL_COMMUNITY): Payer: Self-pay

## 2021-07-13 ENCOUNTER — Other Ambulatory Visit (HOSPITAL_COMMUNITY): Payer: Self-pay

## 2021-07-14 ENCOUNTER — Other Ambulatory Visit (HOSPITAL_COMMUNITY): Payer: Self-pay

## 2021-07-14 MED ORDER — NORETHINDRONE 0.35 MG PO TABS
1.0000 | ORAL_TABLET | Freq: Every day | ORAL | 4 refills | Status: DC
  Filled 2021-07-14 – 2021-08-10 (×2): qty 84, 84d supply, fill #0
  Filled 2021-12-11: qty 84, 84d supply, fill #1
  Filled 2022-03-23: qty 84, 84d supply, fill #2
  Filled 2022-06-19 (×2): qty 84, 84d supply, fill #3

## 2021-07-14 MED FILL — Norethindrone Tab 0.35 MG: ORAL | 28 days supply | Qty: 28 | Fill #3 | Status: AC

## 2021-08-10 ENCOUNTER — Other Ambulatory Visit (HOSPITAL_COMMUNITY): Payer: Self-pay

## 2021-11-07 DIAGNOSIS — R7989 Other specified abnormal findings of blood chemistry: Secondary | ICD-10-CM | POA: Diagnosis not present

## 2021-11-14 ENCOUNTER — Other Ambulatory Visit (HOSPITAL_COMMUNITY): Payer: Self-pay

## 2021-11-14 DIAGNOSIS — M542 Cervicalgia: Secondary | ICD-10-CM | POA: Diagnosis not present

## 2021-11-14 DIAGNOSIS — F418 Other specified anxiety disorders: Secondary | ICD-10-CM | POA: Diagnosis not present

## 2021-11-14 DIAGNOSIS — I781 Nevus, non-neoplastic: Secondary | ICD-10-CM | POA: Diagnosis not present

## 2021-11-14 DIAGNOSIS — D72819 Decreased white blood cell count, unspecified: Secondary | ICD-10-CM | POA: Diagnosis not present

## 2021-11-14 DIAGNOSIS — Z0000 Encounter for general adult medical examination without abnormal findings: Secondary | ICD-10-CM | POA: Diagnosis not present

## 2021-11-14 DIAGNOSIS — Z1331 Encounter for screening for depression: Secondary | ICD-10-CM | POA: Diagnosis not present

## 2021-11-14 DIAGNOSIS — E663 Overweight: Secondary | ICD-10-CM | POA: Diagnosis not present

## 2021-11-14 MED ORDER — PHENTERMINE HCL 15 MG PO CAPS
15.0000 mg | ORAL_CAPSULE | Freq: Every day | ORAL | 1 refills | Status: AC
  Filled 2021-11-14: qty 30, 30d supply, fill #0
  Filled 2021-12-28: qty 30, 30d supply, fill #1

## 2021-11-20 ENCOUNTER — Emergency Department (HOSPITAL_COMMUNITY): Payer: 59

## 2021-11-20 ENCOUNTER — Other Ambulatory Visit: Payer: Self-pay

## 2021-11-20 ENCOUNTER — Encounter (HOSPITAL_COMMUNITY): Payer: Self-pay | Admitting: Emergency Medicine

## 2021-11-20 ENCOUNTER — Emergency Department (HOSPITAL_COMMUNITY)
Admission: EM | Admit: 2021-11-20 | Discharge: 2021-11-21 | Disposition: A | Discharge status: Home / Self Care | Payer: 59 | Attending: Medical | Admitting: Medical

## 2021-11-20 DIAGNOSIS — M545 Low back pain, unspecified: Secondary | ICD-10-CM | POA: Insufficient documentation

## 2021-11-20 DIAGNOSIS — M25561 Pain in right knee: Secondary | ICD-10-CM | POA: Insufficient documentation

## 2021-11-20 DIAGNOSIS — Y92410 Unspecified street and highway as the place of occurrence of the external cause: Secondary | ICD-10-CM | POA: Diagnosis not present

## 2021-11-20 DIAGNOSIS — M25461 Effusion, right knee: Secondary | ICD-10-CM | POA: Diagnosis not present

## 2021-11-20 DIAGNOSIS — S81011A Laceration without foreign body, right knee, initial encounter: Secondary | ICD-10-CM | POA: Diagnosis not present

## 2021-11-20 MED ORDER — IBUPROFEN 400 MG PO TABS
400.0000 mg | ORAL_TABLET | Freq: Once | ORAL | Status: AC
Start: 2021-11-20 — End: 2021-11-20
  Administered 2021-11-20: 400 mg via ORAL
  Filled 2021-11-20: qty 1

## 2021-11-20 NOTE — ED Notes (Signed)
Patient in scan. Will obtain vitals when patient  returns
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
## 2021-11-20 NOTE — ED Notes (Addendum)
P 
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
## 2021-11-20 NOTE — Progress Notes (Signed)
Patient was called for CT while in the waiting room and no form of answer x 2

## 2021-11-21 DIAGNOSIS — M545 Low back pain, unspecified: Secondary | ICD-10-CM | POA: Diagnosis not present

## 2021-11-21 DIAGNOSIS — M25561 Pain in right knee: Secondary | ICD-10-CM | POA: Diagnosis not present

## 2021-11-21 MED ORDER — LIDOCAINE HCL (PF) 1 % IJ SOLN
30.0000 mL | Freq: Once | INTRAMUSCULAR | Status: AC
  Administered 2021-11-21: 30 mL
  Filled 2021-11-21: qty 30
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
## 2021-12-01 DIAGNOSIS — M25561 Pain in right knee: Secondary | ICD-10-CM | POA: Diagnosis not present

## 2021-12-11 ENCOUNTER — Other Ambulatory Visit (HOSPITAL_COMMUNITY): Payer: Self-pay

## 2021-12-22 DIAGNOSIS — M25561 Pain in right knee: Secondary | ICD-10-CM | POA: Diagnosis not present

## 2021-12-27 DIAGNOSIS — M25561 Pain in right knee: Secondary | ICD-10-CM | POA: Diagnosis not present

## 2022-01-11 DIAGNOSIS — M25561 Pain in right knee: Secondary | ICD-10-CM | POA: Diagnosis not present

## 2022-01-11 DIAGNOSIS — S82014D Nondisplaced osteochondral fracture of right patella, subsequent encounter for closed fracture with routine healing: Secondary | ICD-10-CM | POA: Diagnosis not present

## 2022-01-17 ENCOUNTER — Other Ambulatory Visit (HOSPITAL_COMMUNITY): Payer: Self-pay

## 2022-01-17 DIAGNOSIS — E663 Overweight: Secondary | ICD-10-CM | POA: Diagnosis not present

## 2022-01-17 MED ORDER — PHENTERMINE HCL 30 MG PO CAPS
30.0000 mg | ORAL_CAPSULE | Freq: Every day | ORAL | 1 refills | Status: DC
Start: 2022-01-17 — End: 2022-04-04
  Filled 2022-01-17: qty 30, 30d supply, fill #0
  Filled 2022-02-28: qty 30, 30d supply, fill #1

## 2022-01-23 ENCOUNTER — Other Ambulatory Visit (HOSPITAL_COMMUNITY): Payer: Self-pay

## 2022-01-23 MED ORDER — CHLORHEXIDINE GLUCONATE 0.12 % MT SOLN
15.0000 mL | Freq: Two times a day (BID) | OROMUCOSAL | 0 refills | Status: AC
  Filled 2022-01-23: qty 473, 16d supply, fill #0

## 2022-01-26 DIAGNOSIS — M25561 Pain in right knee: Secondary | ICD-10-CM | POA: Diagnosis not present

## 2022-01-26 DIAGNOSIS — S82014D Nondisplaced osteochondral fracture of right patella, subsequent encounter for closed fracture with routine healing: Secondary | ICD-10-CM | POA: Diagnosis not present

## 2022-01-31 ENCOUNTER — Other Ambulatory Visit (HOSPITAL_COMMUNITY): Payer: Self-pay

## 2022-02-05 DIAGNOSIS — N62 Hypertrophy of breast: Secondary | ICD-10-CM | POA: Diagnosis not present

## 2022-02-05 DIAGNOSIS — M25561 Pain in right knee: Secondary | ICD-10-CM | POA: Diagnosis not present

## 2022-02-05 DIAGNOSIS — S82014D Nondisplaced osteochondral fracture of right patella, subsequent encounter for closed fracture with routine healing: Secondary | ICD-10-CM | POA: Diagnosis not present

## 2022-02-12 DIAGNOSIS — S82014D Nondisplaced osteochondral fracture of right patella, subsequent encounter for closed fracture with routine healing: Secondary | ICD-10-CM | POA: Diagnosis not present

## 2022-02-12 DIAGNOSIS — M25561 Pain in right knee: Secondary | ICD-10-CM | POA: Diagnosis not present

## 2022-02-14 ENCOUNTER — Other Ambulatory Visit (HOSPITAL_COMMUNITY): Payer: Self-pay

## 2022-02-14 DIAGNOSIS — N760 Acute vaginitis: Secondary | ICD-10-CM | POA: Diagnosis not present

## 2022-02-14 DIAGNOSIS — Z113 Encounter for screening for infections with a predominantly sexual mode of transmission: Secondary | ICD-10-CM | POA: Diagnosis not present

## 2022-02-14 DIAGNOSIS — N898 Other specified noninflammatory disorders of vagina: Secondary | ICD-10-CM | POA: Diagnosis not present

## 2022-02-14 MED ORDER — METRONIDAZOLE 500 MG PO TABS
500.0000 mg | ORAL_TABLET | Freq: Two times a day (BID) | ORAL | 0 refills | Status: AC
  Filled 2022-02-14: qty 14, 7d supply, fill #0

## 2022-02-14 MED ORDER — FLUCONAZOLE 150 MG PO TABS
150.0000 mg | ORAL_TABLET | Freq: Once | ORAL | 0 refills | Status: AC
  Filled 2022-02-14: qty 1, 1d supply, fill #0

## 2022-02-14 MED ORDER — NYSTATIN-TRIAMCINOLONE 100000-0.1 UNIT/GM-% EX CREA
1.0000 | TOPICAL_CREAM | Freq: Two times a day (BID) | CUTANEOUS | 0 refills | Status: AC
  Filled 2022-02-14: qty 30, 15d supply, fill #0

## 2022-02-21 DIAGNOSIS — M25561 Pain in right knee: Secondary | ICD-10-CM | POA: Diagnosis not present

## 2022-02-21 DIAGNOSIS — S82014D Nondisplaced osteochondral fracture of right patella, subsequent encounter for closed fracture with routine healing: Secondary | ICD-10-CM | POA: Diagnosis not present

## 2022-03-01 ENCOUNTER — Other Ambulatory Visit: Payer: Self-pay

## 2022-03-01 ENCOUNTER — Other Ambulatory Visit (HOSPITAL_COMMUNITY): Payer: Self-pay

## 2022-03-01 DIAGNOSIS — S82014D Nondisplaced osteochondral fracture of right patella, subsequent encounter for closed fracture with routine healing: Secondary | ICD-10-CM | POA: Diagnosis not present

## 2022-03-01 DIAGNOSIS — M25561 Pain in right knee: Secondary | ICD-10-CM | POA: Diagnosis not present

## 2022-03-06 DIAGNOSIS — Z1231 Encounter for screening mammogram for malignant neoplasm of breast: Secondary | ICD-10-CM | POA: Diagnosis not present

## 2022-03-23 ENCOUNTER — Other Ambulatory Visit (HOSPITAL_COMMUNITY): Payer: Self-pay

## 2022-04-04 ENCOUNTER — Other Ambulatory Visit (HOSPITAL_COMMUNITY): Payer: Self-pay

## 2022-04-04 MED ORDER — PHENTERMINE HCL 30 MG PO CAPS
30.0000 mg | ORAL_CAPSULE | Freq: Every day | ORAL | 1 refills | Status: AC
  Filled 2022-04-04: qty 30, 30d supply, fill #0

## 2022-04-17 ENCOUNTER — Other Ambulatory Visit (HOSPITAL_COMMUNITY): Payer: Self-pay

## 2022-04-17 DIAGNOSIS — M25511 Pain in right shoulder: Secondary | ICD-10-CM | POA: Diagnosis not present

## 2022-04-17 MED ORDER — MELOXICAM 15 MG PO TABS
15.0000 mg | ORAL_TABLET | Freq: Every day | ORAL | 0 refills | Status: DC
  Filled 2022-04-17: qty 30, 30d supply, fill #0

## 2022-04-24 ENCOUNTER — Other Ambulatory Visit (HOSPITAL_COMMUNITY): Payer: Self-pay

## 2022-05-02 DIAGNOSIS — N898 Other specified noninflammatory disorders of vagina: Secondary | ICD-10-CM | POA: Diagnosis not present

## 2022-05-02 DIAGNOSIS — Z1159 Encounter for screening for other viral diseases: Secondary | ICD-10-CM | POA: Diagnosis not present

## 2022-05-02 DIAGNOSIS — Z114 Encounter for screening for human immunodeficiency virus [HIV]: Secondary | ICD-10-CM | POA: Diagnosis not present

## 2022-05-02 DIAGNOSIS — Z113 Encounter for screening for infections with a predominantly sexual mode of transmission: Secondary | ICD-10-CM | POA: Diagnosis not present

## 2022-05-02 DIAGNOSIS — Z01419 Encounter for gynecological examination (general) (routine) without abnormal findings: Secondary | ICD-10-CM | POA: Diagnosis not present

## 2022-05-29 DIAGNOSIS — M25511 Pain in right shoulder: Secondary | ICD-10-CM | POA: Diagnosis not present

## 2022-06-10 DIAGNOSIS — M25511 Pain in right shoulder: Secondary | ICD-10-CM | POA: Diagnosis not present

## 2022-06-18 ENCOUNTER — Other Ambulatory Visit (HOSPITAL_COMMUNITY): Payer: Self-pay

## 2022-06-18 MED ORDER — PREVIDENT 0.2 % MT SOLN
OROMUCOSAL | 5 refills | Status: AC
  Filled 2022-06-18: qty 473, 30d supply, fill #0

## 2022-06-19 ENCOUNTER — Other Ambulatory Visit (HOSPITAL_COMMUNITY): Payer: Self-pay

## 2022-06-25 ENCOUNTER — Other Ambulatory Visit (HOSPITAL_COMMUNITY): Payer: Self-pay

## 2022-06-25 DIAGNOSIS — M25511 Pain in right shoulder: Secondary | ICD-10-CM | POA: Diagnosis not present

## 2022-06-25 MED ORDER — MELOXICAM 15 MG PO TABS
15.0000 mg | ORAL_TABLET | Freq: Every day | ORAL | 0 refills | Status: AC
  Filled 2022-06-25: qty 30, 30d supply, fill #0

## 2022-06-27 ENCOUNTER — Other Ambulatory Visit (HOSPITAL_COMMUNITY): Payer: Self-pay

## 2022-06-27 MED ORDER — MEGESTROL ACETATE 40 MG PO TABS
40.0000 mg | ORAL_TABLET | Freq: Every day | ORAL | 0 refills | Status: AC
  Filled 2022-06-27: qty 30, 30d supply, fill #0

## 2022-07-16 DIAGNOSIS — M25511 Pain in right shoulder: Secondary | ICD-10-CM | POA: Diagnosis not present

## 2022-07-25 DIAGNOSIS — M25511 Pain in right shoulder: Secondary | ICD-10-CM | POA: Diagnosis not present

## 2022-09-11 ENCOUNTER — Other Ambulatory Visit (HOSPITAL_COMMUNITY): Payer: Self-pay

## 2022-09-11 MED ORDER — NORETHINDRONE 0.35 MG PO TABS
1.0000 | ORAL_TABLET | Freq: Every day | ORAL | 4 refills | Status: DC
  Filled 2022-09-11: qty 84, 84d supply, fill #0
  Filled 2022-12-04: qty 84, 84d supply, fill #1
  Filled 2023-05-21: qty 84, 84d supply, fill #0
  Filled 2023-08-12: qty 84, 84d supply, fill #1

## 2022-11-16 DIAGNOSIS — Z1389 Encounter for screening for other disorder: Secondary | ICD-10-CM | POA: Diagnosis not present

## 2022-11-23 ENCOUNTER — Other Ambulatory Visit (HOSPITAL_COMMUNITY): Payer: Self-pay

## 2022-11-23 DIAGNOSIS — Z1331 Encounter for screening for depression: Secondary | ICD-10-CM | POA: Diagnosis not present

## 2022-11-23 DIAGNOSIS — E669 Obesity, unspecified: Secondary | ICD-10-CM | POA: Diagnosis not present

## 2022-11-23 DIAGNOSIS — Z0000 Encounter for general adult medical examination without abnormal findings: Secondary | ICD-10-CM | POA: Diagnosis not present

## 2022-11-23 DIAGNOSIS — M542 Cervicalgia: Secondary | ICD-10-CM | POA: Diagnosis not present

## 2022-11-23 DIAGNOSIS — D72819 Decreased white blood cell count, unspecified: Secondary | ICD-10-CM | POA: Diagnosis not present

## 2022-11-23 DIAGNOSIS — F418 Other specified anxiety disorders: Secondary | ICD-10-CM | POA: Diagnosis not present

## 2022-11-23 DIAGNOSIS — R030 Elevated blood-pressure reading, without diagnosis of hypertension: Secondary | ICD-10-CM | POA: Diagnosis not present

## 2022-11-23 DIAGNOSIS — R82998 Other abnormal findings in urine: Secondary | ICD-10-CM | POA: Diagnosis not present

## 2022-11-23 DIAGNOSIS — Z1339 Encounter for screening examination for other mental health and behavioral disorders: Secondary | ICD-10-CM | POA: Diagnosis not present

## 2022-11-23 MED ORDER — WEGOVY 0.25 MG/0.5ML ~~LOC~~ SOAJ
0.2500 mg | SUBCUTANEOUS | 1 refills | Status: AC
  Filled 2022-11-23: qty 2, 28d supply, fill #0

## 2022-11-23 MED ORDER — FLUCONAZOLE 100 MG PO TABS
100.0000 mg | ORAL_TABLET | ORAL | 0 refills | Status: AC
  Filled 2022-11-23: qty 3, 5d supply, fill #0

## 2022-11-26 ENCOUNTER — Other Ambulatory Visit: Payer: Self-pay

## 2022-12-04 ENCOUNTER — Other Ambulatory Visit (HOSPITAL_COMMUNITY): Payer: Self-pay

## 2022-12-06 ENCOUNTER — Other Ambulatory Visit (HOSPITAL_COMMUNITY): Payer: Self-pay

## 2023-01-22 ENCOUNTER — Other Ambulatory Visit (HOSPITAL_COMMUNITY): Payer: Self-pay

## 2023-01-22 DIAGNOSIS — N898 Other specified noninflammatory disorders of vagina: Secondary | ICD-10-CM | POA: Diagnosis not present

## 2023-01-22 DIAGNOSIS — N921 Excessive and frequent menstruation with irregular cycle: Secondary | ICD-10-CM | POA: Diagnosis not present

## 2023-01-22 DIAGNOSIS — B3731 Acute candidiasis of vulva and vagina: Secondary | ICD-10-CM | POA: Diagnosis not present

## 2023-01-22 DIAGNOSIS — I10 Essential (primary) hypertension: Secondary | ICD-10-CM | POA: Diagnosis not present

## 2023-01-23 ENCOUNTER — Other Ambulatory Visit (HOSPITAL_COMMUNITY): Payer: Self-pay

## 2023-01-23 MED ORDER — FLUCONAZOLE 150 MG PO TABS
150.0000 mg | ORAL_TABLET | ORAL | 1 refills | Status: AC
  Filled 2023-01-23: qty 3, 9d supply, fill #0

## 2023-01-28 ENCOUNTER — Other Ambulatory Visit (HOSPITAL_COMMUNITY): Payer: Self-pay

## 2023-02-04 ENCOUNTER — Other Ambulatory Visit (HOSPITAL_COMMUNITY): Payer: Self-pay

## 2023-02-21 ENCOUNTER — Other Ambulatory Visit (HOSPITAL_COMMUNITY): Payer: Self-pay

## 2023-02-21 MED ORDER — IBUPROFEN 800 MG PO TABS
800.0000 mg | ORAL_TABLET | Freq: Three times a day (TID) | ORAL | 11 refills | Status: AC
  Filled 2023-02-21: qty 30, 10d supply, fill #0
  Filled 2023-04-19: qty 30, 10d supply, fill #1
  Filled 2023-05-10: qty 30, 10d supply, fill #0
  Filled 2023-08-05 – 2023-09-12 (×2): qty 30, 10d supply, fill #1
  Filled 2023-11-05: qty 30, 10d supply, fill #2
  Filled 2023-11-27: qty 30, 10d supply, fill #3
  Filled 2024-01-24: qty 30, 10d supply, fill #4
  Filled 2024-01-29: qty 30, 10d supply, fill #0

## 2023-02-22 ENCOUNTER — Other Ambulatory Visit (HOSPITAL_COMMUNITY): Payer: Self-pay

## 2023-03-07 ENCOUNTER — Other Ambulatory Visit: Payer: Self-pay | Admitting: Medical Genetics

## 2023-03-11 ENCOUNTER — Other Ambulatory Visit (HOSPITAL_COMMUNITY): Payer: Self-pay

## 2023-03-11 MED ORDER — AZITHROMYCIN 250 MG PO TABS
ORAL_TABLET | ORAL | 0 refills | Status: AC
  Filled 2023-03-11: qty 6, 5d supply, fill #0

## 2023-04-04 DIAGNOSIS — Z1231 Encounter for screening mammogram for malignant neoplasm of breast: Secondary | ICD-10-CM | POA: Diagnosis not present

## 2023-04-09 DIAGNOSIS — R102 Pelvic and perineal pain: Secondary | ICD-10-CM | POA: Diagnosis not present

## 2023-04-09 DIAGNOSIS — N920 Excessive and frequent menstruation with regular cycle: Secondary | ICD-10-CM | POA: Diagnosis not present

## 2023-04-09 DIAGNOSIS — D251 Intramural leiomyoma of uterus: Secondary | ICD-10-CM | POA: Diagnosis not present

## 2023-05-01 ENCOUNTER — Other Ambulatory Visit (HOSPITAL_COMMUNITY): Payer: Self-pay

## 2023-05-10 ENCOUNTER — Other Ambulatory Visit (HOSPITAL_COMMUNITY): Payer: Self-pay

## 2023-05-10 ENCOUNTER — Other Ambulatory Visit: Payer: Self-pay

## 2023-05-21 ENCOUNTER — Other Ambulatory Visit (HOSPITAL_COMMUNITY): Payer: Self-pay

## 2023-05-31 ENCOUNTER — Other Ambulatory Visit (HOSPITAL_COMMUNITY): Payer: Self-pay

## 2023-05-31 DIAGNOSIS — E669 Obesity, unspecified: Secondary | ICD-10-CM | POA: Diagnosis not present

## 2023-05-31 DIAGNOSIS — R030 Elevated blood-pressure reading, without diagnosis of hypertension: Secondary | ICD-10-CM | POA: Diagnosis not present

## 2023-05-31 DIAGNOSIS — M542 Cervicalgia: Secondary | ICD-10-CM | POA: Diagnosis not present

## 2023-05-31 DIAGNOSIS — L709 Acne, unspecified: Secondary | ICD-10-CM | POA: Diagnosis not present

## 2023-05-31 DIAGNOSIS — F418 Other specified anxiety disorders: Secondary | ICD-10-CM | POA: Diagnosis not present

## 2023-05-31 DIAGNOSIS — I781 Nevus, non-neoplastic: Secondary | ICD-10-CM | POA: Diagnosis not present

## 2023-05-31 DIAGNOSIS — D219 Benign neoplasm of connective and other soft tissue, unspecified: Secondary | ICD-10-CM | POA: Diagnosis not present

## 2023-05-31 DIAGNOSIS — D72819 Decreased white blood cell count, unspecified: Secondary | ICD-10-CM | POA: Diagnosis not present

## 2023-05-31 MED ORDER — TOPIRAMATE 25 MG PO TABS
25.0000 mg | ORAL_TABLET | Freq: Every day | ORAL | 1 refills | Status: AC
  Filled 2023-05-31: qty 30, 30d supply, fill #0
  Filled 2023-09-12: qty 30, 30d supply, fill #1

## 2023-07-24 DIAGNOSIS — M25572 Pain in left ankle and joints of left foot: Secondary | ICD-10-CM | POA: Diagnosis not present

## 2023-07-25 ENCOUNTER — Other Ambulatory Visit (HOSPITAL_COMMUNITY): Payer: Self-pay

## 2023-07-25 MED ORDER — MELOXICAM 15 MG PO TABS
15.0000 mg | ORAL_TABLET | Freq: Every day | ORAL | 0 refills | Status: AC
Start: 2023-07-24 — End: ?
  Filled 2023-07-25: qty 14, 14d supply, fill #0

## 2023-08-05 ENCOUNTER — Other Ambulatory Visit (HOSPITAL_COMMUNITY): Payer: Self-pay

## 2023-08-15 ENCOUNTER — Other Ambulatory Visit (HOSPITAL_COMMUNITY): Payer: Self-pay

## 2023-08-27 ENCOUNTER — Other Ambulatory Visit: Payer: Self-pay | Admitting: Obstetrics and Gynecology

## 2023-08-27 ENCOUNTER — Other Ambulatory Visit: Payer: Self-pay | Admitting: Interventional Radiology

## 2023-08-27 DIAGNOSIS — D259 Leiomyoma of uterus, unspecified: Secondary | ICD-10-CM

## 2023-09-12 ENCOUNTER — Other Ambulatory Visit (HOSPITAL_COMMUNITY): Payer: Self-pay

## 2023-10-17 DIAGNOSIS — D252 Subserosal leiomyoma of uterus: Secondary | ICD-10-CM | POA: Diagnosis not present

## 2023-10-17 DIAGNOSIS — I10 Essential (primary) hypertension: Secondary | ICD-10-CM | POA: Diagnosis not present

## 2023-10-17 DIAGNOSIS — N920 Excessive and frequent menstruation with regular cycle: Secondary | ICD-10-CM | POA: Diagnosis not present

## 2023-10-17 DIAGNOSIS — N946 Dysmenorrhea, unspecified: Secondary | ICD-10-CM | POA: Diagnosis not present

## 2023-11-05 ENCOUNTER — Other Ambulatory Visit: Payer: Self-pay

## 2023-11-05 ENCOUNTER — Other Ambulatory Visit (HOSPITAL_COMMUNITY): Payer: Self-pay

## 2023-11-05 MED ORDER — NORETHINDRONE 0.35 MG PO TABS
1.0000 | ORAL_TABLET | Freq: Every day | ORAL | 0 refills | Status: DC
  Filled 2023-11-05 (×2): qty 84, 84d supply, fill #0

## 2023-11-07 ENCOUNTER — Other Ambulatory Visit: Payer: Self-pay | Admitting: Medical Genetics

## 2023-11-07 DIAGNOSIS — Z006 Encounter for examination for normal comparison and control in clinical research program: Secondary | ICD-10-CM

## 2023-11-10 ENCOUNTER — Ambulatory Visit
Admission: RE | Admit: 2023-11-10 | Discharge: 2023-11-10 | Disposition: A | Discharge status: Home / Self Care | Source: Ambulatory Visit | Attending: Interventional Radiology | Admitting: Interventional Radiology

## 2023-11-10 DIAGNOSIS — D259 Leiomyoma of uterus, unspecified: Secondary | ICD-10-CM

## 2023-11-10 DIAGNOSIS — D252 Subserosal leiomyoma of uterus: Secondary | ICD-10-CM | POA: Diagnosis not present

## 2023-11-10 DIAGNOSIS — D251 Intramural leiomyoma of uterus: Secondary | ICD-10-CM | POA: Diagnosis not present

## 2023-11-10 MED ORDER — GADOPICLENOL 0.5 MMOL/ML IV SOLN
9.0000 mL | Freq: Once | INTRAVENOUS | Status: AC | PRN
  Administered 2023-11-10: 9 mL via INTRAVENOUS

## 2023-11-19 DIAGNOSIS — Z01419 Encounter for gynecological examination (general) (routine) without abnormal findings: Secondary | ICD-10-CM | POA: Diagnosis not present

## 2023-11-19 DIAGNOSIS — Z309 Encounter for contraceptive management, unspecified: Secondary | ICD-10-CM | POA: Diagnosis not present

## 2023-11-19 DIAGNOSIS — Z113 Encounter for screening for infections with a predominantly sexual mode of transmission: Secondary | ICD-10-CM | POA: Diagnosis not present

## 2023-11-21 ENCOUNTER — Ambulatory Visit
Admission: RE | Admit: 2023-11-21 | Discharge: 2023-11-21 | Disposition: A | Source: Ambulatory Visit | Attending: Obstetrics and Gynecology | Admitting: Obstetrics and Gynecology

## 2023-11-21 DIAGNOSIS — D259 Leiomyoma of uterus, unspecified: Secondary | ICD-10-CM

## 2023-11-21 HISTORY — PX: IR RADIOLOGIST EVAL & MGMT: IMG5224

## 2023-11-21 NOTE — Consult Note (Signed)
 Chief Complaint: Patient was seen in consultation today for uterine fibroids at the request of Cousins,Sheronette  Referring Physician(s): Cousins,Sheronette  History of Present Illness: Patricia Mayo is a 48 y.o. female with a long history of menorrhagia which has been progressively worsening over the past 2 years.  She currently utilizes superabsorbent pads and has very heavy bleeding with passage of clots.  Her cycles are regular occurring once a month and the last 4 between 7 and 8 days.  In addition to menorrhagia, she also describes increasingly painful periods consistent with dysmenorrhea.  MRI of the pelvis dated 11/10/2023 demonstrates multiple intramural and subserosal uterine fibroids with the largest measuring 4.4 cm.  No pedunculated, intracavitary or otherwise high risk fibroids.  No evidence of adenomyosis.  She is up-to-date on her Pap smear.  No other clinical concerns at this time.  Her symptoms are moderately severe, she scored a 22 out of 100 on the uterine fibroid symptom severity score.  Her symptoms are also mild to moderately affecting her quality of life.  She scored an 84 out of 100 on the uterine fibroid quality-of-life score.  Past Medical History:  Diagnosis Date   Acne    she has not been taking med for this (including Accutane)    Obesity     Past Surgical History:  Procedure Laterality Date   BREAST REDUCTION SURGERY Bilateral 07/03/2021   Procedure: MAMMARY REDUCTION  (BREAST);  Surgeon: Leora Lenis, MD;  Location: [facility];  Service: Plastics;  Laterality: Bilateral;   CESAREAN SECTION     index finger     IR RADIOLOGIST EVAL & MGMT  11/21/2023    Allergies: Nickel  Medications: Prior to Admission medications   Medication Sig Start Date End Date Taking? Authorizing Provider  Multiple Vitamin (MULTIVITAMIN) tablet Take 1 tablet by mouth daily.   Yes [provider]  norethindrone  (INCASSIA ) 0.35 MG tablet Take 1  tablet (0.35 mg total) by mouth daily. 11/05/23  Yes   azithromycin  (ZITHROMAX ) 250 MG tablet Take 2 tablets by mouth on day 1 then 1 tablet by mouth on days 2-5 Patient not taking: Reported on 11/21/2023 03/11/23     chlorhexidine  (PERIOGARD ) 0.12 % solution Rinse mouth with 15 mLs (1 capful) for 30 seconds in the morning and the evening after toothbrushing. Expectorate after rinsing, DO NOT SWALLOW Patient not taking: Reported on 11/21/2023 01/22/22     fluconazole  (DIFLUCAN ) 100 MG tablet Take 1 tablet (100 mg total) by mouth every other day for 3 doses Patient not taking: Reported on 11/21/2023 11/23/22     fluconazole  (DIFLUCAN ) 150 MG tablet Take 1 tablet (150 mg total) by mouth on days 1, 3 and 7. Patient not taking: Reported on 11/21/2023 01/23/23     ibuprofen  (ADVIL ) 800 MG tablet Take 1 tablet (800 mg total) by mouth every 8 (eight) hours. Patient not taking: Reported on 11/21/2023 02/21/23     megestrol  (MEGACE ) 40 MG tablet Take 1 tablet (40 mg total) by mouth daily. Patient not taking: Reported on 11/21/2023 06/27/22     meloxicam  (MOBIC ) 15 MG tablet Take 1 tablet (15 mg total) by mouth daily with food for 5-7 days then as needed. Patient not taking: Reported on 11/21/2023 06/25/22     meloxicam  (MOBIC ) 15 MG tablet Take 1 tablet (15 mg total) by mouth daily with meals for 14 days for ankle pain and swelling Patient not taking: Reported on 11/21/2023 07/24/23     metroNIDAZOLE  (FLAGYL )  500 MG tablet Take 1 tablet (500 mg total) by mouth 2 (two) times daily. Patient not taking: Reported on 11/21/2023 02/14/22     norethindrone  (MICRONOR ) 0.35 MG tablet TAKE 1 TABLET BY MOUTH EVERY DAY 02/27/20 08/11/21  Rutherford Gain, MD  norethindrone  (MICRONOR ) 0.35 MG tablet TAKE 1 TABLET BY MOUTH ONCE DAILY. 12/07/19 12/06/20  Gorge Ade, MD  nystatin -triamcinolone  (MYCOLOG II) cream Apply 1 Application topically 2 (two) times daily. Patient not taking: Reported on 11/21/2023 02/14/22      oxyCODONE -acetaminophen  (PERCOCET/ROXICET) 5-325 MG tablet Take 1 tablet by mouth every 4 (four) hours as needed for up to 15 doses Patient not taking: Reported on 11/21/2023 06/19/21     phentermine  15 MG capsule Take 1 capsule (15 mg total) by mouth daily with breakfast. Patient not taking: Reported on 11/21/2023 11/14/21     phentermine  30 MG capsule Take 1 capsule (30 mg total) by mouth daily at breakfast Patient not taking: Reported on 11/21/2023 04/03/22     Semaglutide -Weight Management (WEGOVY ) 0.25 MG/0.5ML SOAJ Inject 0.25 mg into the skin once a week. Patient not taking: Reported on 11/21/2023 11/23/22     SODIUM FLUORIDE , DENTAL RINSE, (PREVIDENT ) 0.2 % SOLN USE AS AN ORAL RINSE, AS DIRECTED ON PACKAGE Patient not taking: Reported on 11/21/2023 06/18/22     topiramate  (TOPAMAX ) 25 MG tablet Take 1 tablet (25 mg total) by mouth at bedtime. Patient not taking: Reported on 11/21/2023 05/31/23     promethazine  (PHENERGAN ) 12.5 MG tablet Take 1 tablet (12.5 mg total) by mouth every 6 (six) hours as needed for nausea 07/03/21 07/26/21       No family history on file.  Social History   Socioeconomic History   Marital status: Married    Spouse name: Not on file   Number of children: 2   Years of education: Not on file   Highest education level: Not on file  Occupational History    Employer: [employer]    Comment: nurse aids at [employer]  Tobacco Use   Smoking status: Never   Smokeless tobacco: Never  Substance and Sexual Activity   Alcohol  use: No   Drug use: No   Sexual activity: Not on file  Other Topics Concern   Not on file  Social History Narrative   Not on file   Social Drivers of Health   Financial Resource Strain: Not on file  Food Insecurity: Not on file  Transportation Needs: Not on file  Physical Activity: Not on file  Stress: Not on file  Social Connections: Not on file   Review of Systems: A 12 point ROS discussed and pertinent positives are indicated in the HPI  above.  All other systems are negative.  Review of Systems  Vital Signs: BP (!) 138/93 (BP Location: Left Arm, Patient Position: Sitting, Cuff Size: Normal)   Pulse 81   Temp 98.5 F (36.9 C) (Oral)   Resp 16   Wt 94.3 kg   SpO2 98%   BMI 34.61 kg/m     Physical Exam Constitutional:      General: She is not in acute distress.    Appearance: Normal appearance.  HENT:     Head: Normocephalic and atraumatic.  Eyes:     General: No scleral icterus. Cardiovascular:     Rate and Rhythm: Normal rate.  Pulmonary:     Effort: Pulmonary effort is normal.  Abdominal:     General: There is no distension.     Tenderness:  There is no abdominal tenderness. There is no guarding.  Skin:    General: Skin is warm and dry.  Neurological:     General: No focal deficit present.     Mental Status: She is alert and oriented to person, place, and time.  Psychiatric:        Mood and Affect: Mood normal.        Behavior: Behavior normal.       Imaging: IR Radiologist Eval & Mgmt Result Date: 11/21/2023 EXAM: NEW PATIENT OFFICE VISIT CHIEF COMPLAINT: SEE NOTE IN EPIC HISTORY OF PRESENT ILLNESS: SEE NOTE IN EPIC REVIEW OF SYSTEMS: SEE NOTE IN EPIC PHYSICAL EXAMINATION: SEE NOTE IN EPIC ASSESSMENT AND PLAN: SEE NOTE IN EPIC Electronically Signed   By: Wilkie Lent M.D.   On: 11/21/2023 08:52   MR PELVIS W WO CONTRAST Result Date: 11/11/2023 EXAM: MR PELVIS WITHOUT AND WITH INTRAVENOUS CONTRAST 11/10/2023 04:35:36 PM TECHNIQUE: Multiplanar multisequence MRI of the pelvis was performed without and with intravenous contrast. Iv contrast dose is 9 cc [employer] COMPARISON: None available. CLINICAL HISTORY: Pt with uterine leiomyoma and heavy periods for 1 year FINDINGS: UTERUS: The uterus measures 12.4 x 7.4 x 7.2 cm. Multiple small fibroids are seen which are intramural and subserosal in location. The largest fibroids measure 4.4 cm and 4.2 cm in maximum diameter. No pedunculated or intracavitary  fibroids are identified. The cervix is unremarkable in appearance. No endometrial lesions. VAGINA: The vaginal canal is unremarkable in appearance. RIGHT OVARY: Right ovary is unremarkable in appearance. LEFT OVARY: Right ovary is unremarkable in appearance. FREE FLUID: No free fluid. BLADDER AND URETHRA: Unremarkable. VISUALIZED GASTROINTESTINAL TRACT: Unremarkable. LYMPH NODES: No lymphadenopathy. VASCULATURE: Unremarkable. BONES: No marrow signal abnormality. IMPRESSION: 1. Multiple intramural and subserosal uterine fibroids, largest measuring 4.4 cm. 2. No pedunculated or intracavitary fibroids identified. 3. No evidence of ovarian or adnexal mass. Electronically signed by: Norleen Kil MD 11/11/2023 01:47 PM EDT RP Workstation: [id number]    Labs:  CBC: No results for input(s): WBC, HGB, HCT, PLT in the last 8760 hours.  COAGS: No results for input(s): INR, APTT in the last 8760 hours.  BMP: No results for input(s): NA, K, CL, CO2, GLUCOSE, BUN, CALCIUM, CREATININE, GFRNONAA, GFRAA in the last 8760 hours.  Invalid input(s): CMP  LIVER FUNCTION TESTS: No results for input(s): BILITOT, AST, ALT, ALKPHOS, PROT, ALBUMIN in the last 8760 hours.  TUMOR MARKERS: No results for input(s): AFPTM, CEA, CA199, CHROMGRNA in the last 8760 hours.  Assessment and Plan:  Very pleasant 48 year old female with a symptomatic uterine fibroids.  Her primary symptoms are menorrhagia and dysmenorrhea.  We discussed the natural history of uterine fibroids as well as potential treatment strategies including uterine artery embolization.  I explained the procedure in detail including the risks, benefits and alternatives.  Time was given to answer all of her questions.  At this time, Mrs. Ketchum is very interested in proceeding with treatment.  She would like to talk with our schedulers next week once she finds out her optimal availability.  1.) Please reach  out to Mrs. Aarons next week to schedule for December.     Thank you for this interesting consult.  I greatly enjoyed meeting Candus Braud V. and look forward to participating in their care.  A copy of this report was sent to the requesting provider on this date.  Electronically Signed: Wilkie MARLA Lent 11/21/2023, 10:17 AM   I spent a total of 40 Minutes  in face  to face in clinical consultation, greater than 50% of which was counseling/coordinating care for symptomatic uterine fibroids

## 2023-11-27 ENCOUNTER — Ambulatory Visit (HOSPITAL_COMMUNITY)
Admission: RE | Admit: 2023-11-27 | Discharge: 2023-11-27 | Disposition: A | Discharge status: Home / Self Care | Payer: Self-pay | Source: Ambulatory Visit | Attending: Family Medicine | Admitting: Family Medicine

## 2023-11-27 ENCOUNTER — Other Ambulatory Visit (HOSPITAL_COMMUNITY): Payer: Self-pay

## 2023-11-27 ENCOUNTER — Other Ambulatory Visit (HOSPITAL_COMMUNITY): Payer: Self-pay | Admitting: Family Medicine

## 2023-11-27 DIAGNOSIS — M549 Dorsalgia, unspecified: Secondary | ICD-10-CM | POA: Diagnosis not present

## 2023-11-27 DIAGNOSIS — Z026 Encounter for examination for insurance purposes: Secondary | ICD-10-CM

## 2023-11-27 DIAGNOSIS — M25511 Pain in right shoulder: Secondary | ICD-10-CM | POA: Diagnosis not present

## 2023-11-27 DIAGNOSIS — M5127 Other intervertebral disc displacement, lumbosacral region: Secondary | ICD-10-CM | POA: Diagnosis not present

## 2023-11-28 DIAGNOSIS — R030 Elevated blood-pressure reading, without diagnosis of hypertension: Secondary | ICD-10-CM | POA: Diagnosis not present

## 2023-11-28 DIAGNOSIS — D72819 Decreased white blood cell count, unspecified: Secondary | ICD-10-CM | POA: Diagnosis not present

## 2023-12-03 DIAGNOSIS — R82998 Other abnormal findings in urine: Secondary | ICD-10-CM | POA: Diagnosis not present

## 2024-01-21 ENCOUNTER — Other Ambulatory Visit (HOSPITAL_COMMUNITY): Payer: Self-pay

## 2024-01-24 ENCOUNTER — Other Ambulatory Visit (HOSPITAL_COMMUNITY): Payer: Self-pay

## 2024-01-27 ENCOUNTER — Other Ambulatory Visit (HOSPITAL_COMMUNITY): Payer: Self-pay

## 2024-01-29 ENCOUNTER — Other Ambulatory Visit (HOSPITAL_COMMUNITY): Payer: Self-pay

## 2024-01-30 ENCOUNTER — Other Ambulatory Visit (HOSPITAL_COMMUNITY): Payer: Self-pay

## 2024-01-30 ENCOUNTER — Other Ambulatory Visit: Payer: Self-pay

## 2024-01-30 ENCOUNTER — Encounter (HOSPITAL_COMMUNITY): Payer: Self-pay

## 2024-01-30 MED ORDER — NORETHINDRONE 0.35 MG PO TABS
1.0000 | ORAL_TABLET | Freq: Every day | ORAL | 4 refills | Status: AC
  Filled 2024-01-30 (×2): qty 84, 84d supply, fill #0

## 2024-01-31 ENCOUNTER — Other Ambulatory Visit (HOSPITAL_COMMUNITY): Payer: Self-pay

## 2024-02-07 ENCOUNTER — Other Ambulatory Visit (HOSPITAL_COMMUNITY): Payer: Self-pay

## 2024-02-07 MED ORDER — MELOXICAM 15 MG PO TABS
15.0000 mg | ORAL_TABLET | Freq: Every day | ORAL | 0 refills | Status: AC | PRN
  Filled 2024-02-07: qty 30, 30d supply, fill #0

## 2024-02-10 ENCOUNTER — Other Ambulatory Visit: Payer: Self-pay

## 2024-02-12 ENCOUNTER — Other Ambulatory Visit (HOSPITAL_COMMUNITY): Payer: Self-pay

## 2024-02-12 ENCOUNTER — Other Ambulatory Visit: Payer: Self-pay

## 2024-02-12 MED ORDER — BISACODYL 5 MG PO TBEC
DELAYED_RELEASE_TABLET | ORAL | 0 refills | Status: AC
  Filled 2024-02-12 (×2): qty 4, 1d supply, fill #0

## 2024-02-12 MED ORDER — PEG 3350-KCL-NA BICARB-NACL 420 G PO SOLR
ORAL | 0 refills | Status: AC
  Filled 2024-02-12 (×2): qty 4000, 1d supply, fill #0

## 2024-02-13 ENCOUNTER — Other Ambulatory Visit: Payer: Self-pay

## 2024-02-14 ENCOUNTER — Other Ambulatory Visit (HOSPITAL_COMMUNITY): Payer: Self-pay | Admitting: Family Medicine

## 2024-02-14 DIAGNOSIS — M5459 Other low back pain: Secondary | ICD-10-CM

## 2024-02-19 ENCOUNTER — Other Ambulatory Visit (HOSPITAL_COMMUNITY): Payer: Self-pay

## 2024-02-27 ENCOUNTER — Ambulatory Visit (HOSPITAL_COMMUNITY)
